# Patient Record
Sex: Male | Born: 1950 | Race: White | Hispanic: No | Marital: Single | Smoking: Former smoker
Health system: Southern US, Community
[De-identification: ages and names within clinical notes are randomized; demographics above are authoritative.]

## PROBLEM LIST (undated history)

## (undated) DIAGNOSIS — E559 Vitamin D deficiency, unspecified: Secondary | ICD-10-CM

## (undated) DIAGNOSIS — I871 Compression of vein: Secondary | ICD-10-CM

## (undated) DIAGNOSIS — I82409 Acute embolism and thrombosis of unspecified deep veins of unspecified lower extremity: Secondary | ICD-10-CM

## (undated) DIAGNOSIS — E785 Hyperlipidemia, unspecified: Secondary | ICD-10-CM

## (undated) DIAGNOSIS — E039 Hypothyroidism, unspecified: Secondary | ICD-10-CM

## (undated) DIAGNOSIS — N200 Calculus of kidney: Secondary | ICD-10-CM

## (undated) DIAGNOSIS — E538 Deficiency of other specified B group vitamins: Secondary | ICD-10-CM

## (undated) DIAGNOSIS — R7989 Other specified abnormal findings of blood chemistry: Secondary | ICD-10-CM

## (undated) DIAGNOSIS — F319 Bipolar disorder, unspecified: Secondary | ICD-10-CM

## (undated) DIAGNOSIS — I10 Essential (primary) hypertension: Secondary | ICD-10-CM

## (undated) HISTORY — DX: Compression of vein: I87.1

## (undated) HISTORY — DX: Hyperlipidemia, unspecified: E78.5

## (undated) HISTORY — DX: Essential (primary) hypertension: I10

## (undated) HISTORY — PX: ANKLE FRACTURE SURGERY: SHX122

## (undated) HISTORY — DX: Calculus of kidney: N20.0

---
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
## 1998-12-12 ENCOUNTER — Emergency Department (HOSPITAL_COMMUNITY): Admission: EM | Admit: 1998-12-12 | Discharge: 1998-12-12 | Payer: Self-pay | Admitting: Emergency Medicine

## 2008-02-17 ENCOUNTER — Ambulatory Visit: Payer: Self-pay | Admitting: Internal Medicine

## 2008-02-17 LAB — CONVERTED CEMR LAB
Albumin: 4.8 g/dL (ref 3.5–5.2)
CO2: 23 meq/L (ref 19–32)
Cholesterol: 294 mg/dL — ABNORMAL HIGH (ref 0–200)
Direct LDL: 189 mg/dL — ABNORMAL HIGH
Eosinophils Relative: 4 % (ref 0–5)
Glucose, Bld: 95 mg/dL (ref 70–99)
HCT: 51 % (ref 39.0–52.0)
Lymphocytes Relative: 28 % (ref 12–46)
Lymphs Abs: 1.8 10*3/uL (ref 0.7–4.0)
Neutrophils Relative %: 58 % (ref 43–77)
Platelets: 235 10*3/uL (ref 150–400)
Potassium: 4.7 meq/L (ref 3.5–5.3)
RBC: 4.88 M/uL (ref 4.22–5.81)
Sodium: 140 meq/L (ref 135–145)
Total Protein: 7.8 g/dL (ref 6.0–8.3)
Triglycerides: 447 mg/dL — ABNORMAL HIGH (ref <150)
WBC: 6.2 10*3/uL (ref 4.0–10.5)

## 2008-03-16 ENCOUNTER — Ambulatory Visit: Payer: Self-pay | Admitting: Internal Medicine

## 2008-03-16 LAB — CONVERTED CEMR LAB
ALT: 25 units/L (ref 0–53)
AST: 29 units/L (ref 0–37)
Albumin: 4.3 g/dL (ref 3.5–5.2)
Calcium: 9.6 mg/dL (ref 8.4–10.5)
Chloride: 105 meq/L (ref 96–112)
Direct LDL: 172 mg/dL — ABNORMAL HIGH
Potassium: 4 meq/L (ref 3.5–5.3)
Total CHOL/HDL Ratio: 6

## 2008-10-06 ENCOUNTER — Ambulatory Visit: Payer: Self-pay | Admitting: Internal Medicine

## 2009-01-23 ENCOUNTER — Ambulatory Visit: Payer: Self-pay | Admitting: Diagnostic Radiology

## 2009-01-23 ENCOUNTER — Emergency Department (HOSPITAL_BASED_OUTPATIENT_CLINIC_OR_DEPARTMENT_OTHER): Admission: EM | Admit: 2009-01-23 | Discharge: 2009-01-23 | Payer: Self-pay | Admitting: Emergency Medicine

## 2010-11-05 LAB — URINALYSIS, ROUTINE W REFLEX MICROSCOPIC
Glucose, UA: NEGATIVE mg/dL
Leukocytes, UA: NEGATIVE
Nitrite: NEGATIVE
Specific Gravity, Urine: 1.034 — ABNORMAL HIGH (ref 1.005–1.030)
pH: 5.5 (ref 5.0–8.0)

## 2010-11-05 LAB — URINE MICROSCOPIC-ADD ON

## 2010-11-05 LAB — POCT TOXICOLOGY PANEL: Tetrahydrocannabinol: POSITIVE

## 2010-11-05 LAB — DIFFERENTIAL
Eosinophils Relative: 0 % (ref 0–5)
Lymphocytes Relative: 15 % (ref 12–46)
Lymphs Abs: 1.4 10*3/uL (ref 0.7–4.0)
Neutro Abs: 6.9 10*3/uL (ref 1.7–7.7)
Neutrophils Relative %: 74 % (ref 43–77)

## 2010-11-05 LAB — CBC
HCT: 42 % (ref 39.0–52.0)
Platelets: 230 10*3/uL (ref 150–400)
WBC: 9.3 10*3/uL (ref 4.0–10.5)

## 2010-11-05 LAB — ETHANOL: Alcohol, Ethyl (B): <10 mg/dL (ref 0–10)

## 2010-11-05 LAB — BASIC METABOLIC PANEL
BUN: 17 mg/dL (ref 6–23)
Calcium: 9.2 mg/dL (ref 8.4–10.5)
GFR calc non Af Amer: >60 mL/min (ref >60)
Potassium: 3.9 mEq/L (ref 3.5–5.1)

## 2011-02-26 ENCOUNTER — Emergency Department (HOSPITAL_COMMUNITY): Payer: Medicare Other

## 2011-02-26 ENCOUNTER — Emergency Department (HOSPITAL_COMMUNITY)
Admission: EM | Admit: 2011-02-26 | Discharge: 2011-02-26 | Disposition: A | Discharge status: Home / Self Care | Payer: Medicare Other | Attending: Emergency Medicine | Admitting: Emergency Medicine

## 2011-02-26 DIAGNOSIS — M25476 Effusion, unspecified foot: Secondary | ICD-10-CM | POA: Insufficient documentation

## 2011-02-26 DIAGNOSIS — M2560 Stiffness of unspecified joint, not elsewhere classified: Secondary | ICD-10-CM | POA: Insufficient documentation

## 2011-02-26 DIAGNOSIS — M25473 Effusion, unspecified ankle: Secondary | ICD-10-CM | POA: Insufficient documentation

## 2011-02-26 DIAGNOSIS — S92009A Unspecified fracture of unspecified calcaneus, initial encounter for closed fracture: Secondary | ICD-10-CM | POA: Insufficient documentation

## 2011-02-26 DIAGNOSIS — W11XXXA Fall on and from ladder, initial encounter: Secondary | ICD-10-CM | POA: Insufficient documentation

## 2011-02-26 DIAGNOSIS — F319 Bipolar disorder, unspecified: Secondary | ICD-10-CM | POA: Insufficient documentation

## 2011-02-26 DIAGNOSIS — M25579 Pain in unspecified ankle and joints of unspecified foot: Secondary | ICD-10-CM | POA: Insufficient documentation

## 2011-03-03 NOTE — Consult Note (Signed)
Omar Rogers, DAY NO.:  [id number]  MEDICAL RECORD NO.:  [id number]  LOCATION:  [facility]                         FACILITY:  [facility]  PHYSICIAN:  Omar Arthurs, MD        DATE OF BIRTH:  1950/12/26  DATE OF CONSULTATION:  02/26/2011 DATE OF DISCHARGE:  02/26/2011                                CONSULTATION   REASON FOR CONSULTATION:  Right foot injury.  HISTORY OF PRESENT ILLNESS:  The patient is a 60 year old male with past medical history significant for bipolar disorder who fell an estimated 7 feet off of a ladder today.  He describes landing on his feet and denies any loss of consciousness.  He found it impossible to bear weight on his right foot after his injury and presents to the emergency department for further evaluation and treatment.  He denies any previous history of injury or surgery to this foot or ankle in the past.  He is not a smoker.  He is not diabetic.  He complains of severe pain in the heel that is worse with any attempt of bearing weight and is better with rest and elevation.  He is currently on disability due to his bipolar disorder.  PAST MEDICAL HISTORY:  Bipolar disorder.  PAST SURGICAL HISTORY:  None.  SOCIAL HISTORY:  The patient is on disability for bipolar disorder.  He does not use any tobacco products.  He drinks alcohol daily and admits to 2 beers per day.  FAMILY HISTORY:  Positive for hypertension in both of his parents.  REVIEW OF SYSTEMS:  No recent fever, chills, nausea, vomiting or changes of his appetite.  Review of systems as above and otherwise negative.  Physical exam, the patient is a well-nourished, well-developed male in no apparent distress.  He is alert and oriented x4.  Mood and affect are normal.  Extraocular motions are intact.  Respirations are unlabored. His foot is swollen posteriorly.  There is no breaks in the skin. Pulses are palpable.  Sensibility to light touch is intact.  He has 5/5 strength in  plantarflexion and dorsiflexion of his toes.  There is no lymphadenopathy noted.  X-rays, Harris heel view as well as AP, lateral, and mortise views of the ankle are obtained.  These show a displaced calcaneus fracture.  The posterior facet appears to have a longitudinal split and the lateral segment is depressed, relative to medial segment.  ASSESSMENT:  Right calcaneus fracture.  PLAN:  I explained to the patient and his wife the nature of this injury.  Based on his examination today I have asked Dr. Hyman Hopes to order a fine-cut CT of the calcaneus.  I am going to mobilize him in a short-leg splint and follow up with me for the next few days to check his skin and to schedule surgery.  He understands this plan and agrees this is a severe injury that has the significant risk of limiting his function.  Treatment of this injury requires complex medical decision making.     Omar Arthurs, MD     JH/MEDQ  D:  02/26/2011  T:  02/27/2011  Job:  [id number]  Electronically Signed by Omar Rogers  on  03/03/2011 03:16:54 PM

## 2011-03-05 ENCOUNTER — Emergency Department (HOSPITAL_COMMUNITY)
Admission: EM | Admit: 2011-03-05 | Discharge: 2011-03-06 | Disposition: A | Discharge status: Home / Self Care | Payer: Medicare Other | Attending: Emergency Medicine | Admitting: Emergency Medicine

## 2011-03-05 ENCOUNTER — Emergency Department (HOSPITAL_COMMUNITY): Payer: Medicare Other

## 2011-03-05 DIAGNOSIS — R509 Fever, unspecified: Secondary | ICD-10-CM | POA: Insufficient documentation

## 2011-03-05 DIAGNOSIS — R000 Tachycardia, unspecified: Secondary | ICD-10-CM | POA: Insufficient documentation

## 2011-03-05 DIAGNOSIS — M79609 Pain in unspecified limb: Secondary | ICD-10-CM | POA: Insufficient documentation

## 2011-03-05 DIAGNOSIS — Z9889 Other specified postprocedural states: Secondary | ICD-10-CM | POA: Insufficient documentation

## 2011-03-05 DIAGNOSIS — F319 Bipolar disorder, unspecified: Secondary | ICD-10-CM | POA: Insufficient documentation

## 2011-03-05 DIAGNOSIS — Z79899 Other long term (current) drug therapy: Secondary | ICD-10-CM | POA: Insufficient documentation

## 2011-03-05 DIAGNOSIS — M25579 Pain in unspecified ankle and joints of unspecified foot: Secondary | ICD-10-CM | POA: Insufficient documentation

## 2011-03-06 ENCOUNTER — Emergency Department (HOSPITAL_COMMUNITY): Payer: Medicare Other

## 2011-03-06 LAB — DIFFERENTIAL
Basophils Relative: 0 % (ref 0–1)
Eosinophils Absolute: 0 10*3/uL (ref 0.0–0.7)
Eosinophils Relative: 0 % (ref 0–5)
Neutrophils Relative %: 80 % — ABNORMAL HIGH (ref 43–77)

## 2011-03-06 LAB — CBC
MCH: 35.1 pg — ABNORMAL HIGH (ref 26.0–34.0)
Platelets: 206 10*3/uL (ref 150–400)
RBC: 3.9 MIL/uL — ABNORMAL LOW (ref 4.22–5.81)
RDW: 13 % (ref 11.5–15.5)
WBC: 11.2 10*3/uL — ABNORMAL HIGH (ref 4.0–10.5)

## 2011-03-12 LAB — CULTURE, BLOOD (ROUTINE X 2): Culture: NO GROWTH

## 2013-06-10 ENCOUNTER — Other Ambulatory Visit: Payer: Self-pay | Admitting: *Deleted

## 2013-06-10 DIAGNOSIS — I83893 Varicose veins of bilateral lower extremities with other complications: Secondary | ICD-10-CM

## 2013-06-29 ENCOUNTER — Inpatient Hospital Stay (HOSPITAL_COMMUNITY)
Admission: EM | Admit: 2013-06-29 | Discharge: 2013-07-02 | DRG: 238 | Disposition: A | Discharge status: Home / Self Care | Payer: Medicare Other | Attending: Internal Medicine | Admitting: Internal Medicine

## 2013-06-29 ENCOUNTER — Encounter (HOSPITAL_COMMUNITY): Payer: Self-pay | Admitting: Emergency Medicine

## 2013-06-29 DIAGNOSIS — F319 Bipolar disorder, unspecified: Secondary | ICD-10-CM | POA: Diagnosis present

## 2013-06-29 DIAGNOSIS — R310 Gross hematuria: Secondary | ICD-10-CM | POA: Diagnosis not present

## 2013-06-29 DIAGNOSIS — O2230 Deep phlebothrombosis in pregnancy, unspecified trimester: Secondary | ICD-10-CM | POA: Diagnosis present

## 2013-06-29 DIAGNOSIS — I824Y9 Acute embolism and thrombosis of unspecified deep veins of unspecified proximal lower extremity: Principal | ICD-10-CM | POA: Diagnosis present

## 2013-06-29 DIAGNOSIS — Z87891 Personal history of nicotine dependence: Secondary | ICD-10-CM

## 2013-06-29 DIAGNOSIS — Z79899 Other long term (current) drug therapy: Secondary | ICD-10-CM

## 2013-06-29 DIAGNOSIS — I82402 Acute embolism and thrombosis of unspecified deep veins of left lower extremity: Secondary | ICD-10-CM

## 2013-06-29 DIAGNOSIS — I82409 Acute embolism and thrombosis of unspecified deep veins of unspecified lower extremity: Secondary | ICD-10-CM

## 2013-06-29 DIAGNOSIS — D72829 Elevated white blood cell count, unspecified: Secondary | ICD-10-CM | POA: Diagnosis present

## 2013-06-29 DIAGNOSIS — I871 Compression of vein: Secondary | ICD-10-CM | POA: Diagnosis present

## 2013-06-29 DIAGNOSIS — E871 Hypo-osmolality and hyponatremia: Secondary | ICD-10-CM | POA: Diagnosis present

## 2013-06-29 DIAGNOSIS — I82629 Acute embolism and thrombosis of deep veins of unspecified upper extremity: Secondary | ICD-10-CM

## 2013-06-29 HISTORY — DX: Acute embolism and thrombosis of unspecified deep veins of unspecified lower extremity: I82.409

## 2013-06-29 HISTORY — DX: Bipolar disorder, unspecified: F31.9

## 2013-06-29 LAB — CBC WITH DIFFERENTIAL/PLATELET
Basophils Relative: 0 % (ref 0–1)
Eosinophils Absolute: 0.2 10*3/uL (ref 0.0–0.7)
Eosinophils Relative: 1 % (ref 0–5)
Hemoglobin: 16.7 g/dL (ref 13.0–17.0)
Lymphs Abs: 1.1 10*3/uL (ref 0.7–4.0)
MCH: 36.7 pg — ABNORMAL HIGH (ref 26.0–34.0)
MCHC: 34.5 g/dL (ref 30.0–36.0)
MCV: 106.4 fL — ABNORMAL HIGH (ref 78.0–100.0)
Monocytes Absolute: 1.6 10*3/uL — ABNORMAL HIGH (ref 0.1–1.0)
Monocytes Relative: 15 % — ABNORMAL HIGH (ref 3–12)
Neutrophils Relative %: 75 % (ref 43–77)
RBC: 4.55 MIL/uL (ref 4.22–5.81)

## 2013-06-29 LAB — COMPREHENSIVE METABOLIC PANEL
Albumin: 3.3 g/dL — ABNORMAL LOW (ref 3.5–5.2)
Alkaline Phosphatase: 79 U/L (ref 39–117)
BUN: 12 mg/dL (ref 6–23)
CO2: 21 mEq/L (ref 19–32)
Chloride: 99 mEq/L (ref 96–112)
Creatinine, Ser: 0.72 mg/dL (ref 0.50–1.35)
GFR calc Af Amer: >90 mL/min (ref >90)
GFR calc non Af Amer: >90 mL/min (ref >90)
Glucose, Bld: 107 mg/dL — ABNORMAL HIGH (ref 70–99)
Potassium: 4.8 mEq/L (ref 3.5–5.1)
Total Bilirubin: 1.2 mg/dL (ref 0.3–1.2)

## 2013-06-29 LAB — FIBRINOGEN: Fibrinogen: 520 mg/dL — ABNORMAL HIGH (ref 204–475)

## 2013-06-29 LAB — URINALYSIS, ROUTINE W REFLEX MICROSCOPIC
Bilirubin Urine: NEGATIVE
Glucose, UA: NEGATIVE mg/dL
Ketones, ur: 15 mg/dL — AB
Leukocytes, UA: NEGATIVE
Nitrite: NEGATIVE
Protein, ur: NEGATIVE mg/dL
Urobilinogen, UA: 1 mg/dL (ref 0.0–1.0)

## 2013-06-29 LAB — PROTIME-INR: INR: 1.09 (ref 0.00–1.49)

## 2013-06-29 LAB — HEPARIN LEVEL (UNFRACTIONATED): Heparin Unfractionated: <0.1 IU/mL — ABNORMAL LOW (ref 0.30–0.70)

## 2013-06-29 MED ORDER — HEPARIN BOLUS VIA INFUSION
4000.0000 [IU] | Freq: Once | INTRAVENOUS | Status: AC
  Administered 2013-06-29: 4000 [IU] via INTRAVENOUS
  Filled 2013-06-29: qty 4000

## 2013-06-29 MED ORDER — OXYCODONE HCL 5 MG PO TABS
5.0000 mg | ORAL_TABLET | ORAL | Status: DC | PRN
  Administered 2013-06-29 – 2013-07-02 (×8): 5 mg via ORAL
  Filled 2013-06-29 (×8): qty 1

## 2013-06-29 MED ORDER — HEPARIN BOLUS VIA INFUSION
3000.0000 [IU] | Freq: Once | INTRAVENOUS | Status: AC
  Administered 2013-06-29: 3000 [IU] via INTRAVENOUS
  Filled 2013-06-29: qty 3000

## 2013-06-29 MED ORDER — MORPHINE SULFATE 2 MG/ML IJ SOLN
1.0000 mg | INTRAMUSCULAR | Status: DC | PRN
  Administered 2013-06-29: 1 mg via INTRAVENOUS
  Filled 2013-06-29: qty 1

## 2013-06-29 MED ORDER — HEPARIN (PORCINE) IN NACL 100-0.45 UNIT/ML-% IJ SOLN
1600.0000 [IU]/h | INTRAMUSCULAR | Status: DC
  Administered 2013-06-29: 1300 [IU]/h via INTRAVENOUS
  Administered 2013-06-29: 1600 [IU]/h via INTRAVENOUS
  Filled 2013-06-29 (×3): qty 250

## 2013-06-29 MED ORDER — SODIUM CHLORIDE 0.9 % IJ SOLN
3.0000 mL | Freq: Two times a day (BID) | INTRAMUSCULAR | Status: DC
  Administered 2013-06-30: 3 mL via INTRAVENOUS

## 2013-06-29 NOTE — H&P (Addendum)
Chief Complaint: "Left leg swelling x 4 days, left leg pain x 1 month." Referring Physician: Dr. Myra Gianotti HPI: Omar Rogers is an 62 y.o. male with no significant PMHx, he presents today after being see as an outpatient and found to have extensive LLE DVT. The patient was seen by Dr. Jimmie Molly at [facility]. The patient was found to have a DVT from the mid thigh up to the common femoral and possible extension into the iliac vein. He c/o LLE pain x 1 month starting distal and progressing proximal to his upper thigh with increased pain now. He states he did not notice swelling until 4 days ago. He denies any shortness of breath or chest pain. He denies any history of cancer, recent surgeries, recent travel, or injury. He denies any history of DVT or family history of clotting disorders. He denies any tobacco use or change in medications, he does admit that he is not very active. He denies any history of bleeding disorders, gastrointestinal bleeding or brain bleeding. He denies any active bleeding, blood in his stool or urine. Request received for image guided LLE venogram with possible lysis and intervention.   Past Medical History: History reviewed. No pertinent past medical history.  Past Surgical History:  Past Surgical History  Procedure Laterality Date  . Right foot surgery      Family History: History reviewed. No pertinent family history.  Social History:  reports that he has never smoked. He does not have any smokeless tobacco history on file. He reports that he drinks alcohol. His drug history is not on file.  Allergies: No Known Allergies  Medications:   Medication List    ASK your doctor about these medications       naproxen sodium 220 MG tablet  Commonly known as:  ANAPROX  Take 220 mg by mouth once.       Please HPI for pertinent positives, otherwise complete 10 system ROS negative.  Physical Exam: BP 128/78  Pulse 88  Temp(Src) 98.3 F (36.8 C)  (Oral)  Resp 16  Ht 5\' 10"  (1.778 m)  Wt 200 lb (90.719 kg)  BMI 28.70 kg/m2  SpO2 95% Body mass index is 28.7 kg/(m^2).  General Appearance:  Alert, cooperative, no distress  Head:  Normocephalic, without obvious abnormality, atraumatic  Neck: Supple, symmetrical, trachea midline  Lungs:   Clear to auscultation bilaterally, no w/r/r, respirations unlabored without use of accessory muscles.  Chest Wall:  No tenderness or deformity  Heart:  Regular rate and rhythm, S1, S2 normal, no murmur, rub or gallop.  Abdomen:   Soft, non-tender, non distended, (+) BS  Extremities: LLE proximal erythematous and warm to touch, distal foot slight cyanosis with 3+ edema throughout, atraumatic, RLE without skin changes or edema  Pulses: RLE 2+, LLE doppler   Neurologic: Normal affect, no gross deficits.   Results for orders placed during the hospital encounter of 06/29/13 (from the past 48 hour(s))  CBC WITH DIFFERENTIAL     Status: Abnormal   Collection Time    06/29/13  1:21 PM      Result Value Range   WBC 11.1 (*) 4.0 - 10.5 K/uL   RBC 4.55  4.22 - 5.81 MIL/uL   Hemoglobin 16.7  13.0 - 17.0 g/dL   HCT 16.1  09.6 - 04.5 %   MCV 106.4 (*) 78.0 - 100.0 fL   MCH 36.7 (*) 26.0 - 34.0 pg   MCHC 34.5  30.0 - 36.0 g/dL  RDW 13.8  11.5 - 15.5 %   Platelets 131 (*) 150 - 400 K/uL   Neutrophils Relative % 75  43 - 77 %   Neutro Abs 8.3 (*) 1.7 - 7.7 K/uL   Lymphocytes Relative 9 (*) 12 - 46 %   Lymphs Abs 1.1  0.7 - 4.0 K/uL   Monocytes Relative 15 (*) 3 - 12 %   Monocytes Absolute 1.6 (*) 0.1 - 1.0 K/uL   Eosinophils Relative 1  0 - 5 %   Eosinophils Absolute 0.2  0.0 - 0.7 K/uL   Basophils Relative 0  0 - 1 %   Basophils Absolute 0.0  0.0 - 0.1 K/uL  PROTIME-INR     Status: None   Collection Time    06/29/13  1:21 PM      Result Value Range   Prothrombin Time 13.9  11.6 - 15.2 seconds   INR 1.09  0.00 - 1.49  APTT     Status: None   Collection Time    06/29/13  1:21 PM      Result Value  Range   aPTT 31  24 - 37 seconds  COMPREHENSIVE METABOLIC PANEL     Status: Abnormal   Collection Time    06/29/13  1:21 PM      Result Value Range   Sodium 133 (*) 135 - 145 mEq/L   Potassium 4.8  3.5 - 5.1 mEq/L   Comment: HEMOLYSIS AT THIS LEVEL MAY AFFECT RESULT   Chloride 99  96 - 112 mEq/L   CO2 21  19 - 32 mEq/L   Glucose, Bld 107 (*) 70 - 99 mg/dL   BUN 12  6 - 23 mg/dL   Creatinine, Ser 1.61  0.50 - 1.35 mg/dL   Calcium 8.6  8.4 - 09.6 mg/dL   Total Protein 6.5  6.0 - 8.3 g/dL   Albumin 3.3 (*) 3.5 - 5.2 g/dL   AST 41 (*) 0 - 37 U/L   Comment: HEMOLYSIS AT THIS LEVEL MAY AFFECT RESULT   ALT 27  0 - 53 U/L   Comment: HEMOLYSIS AT THIS LEVEL MAY AFFECT RESULT   Alkaline Phosphatase 79  39 - 117 U/L   Total Bilirubin 1.2  0.3 - 1.2 mg/dL   GFR calc non Af Amer >90  >90 mL/min   GFR calc Af Amer >90  >90 mL/min   Comment: (NOTE)     The eGFR has been calculated using the CKD EPI equation.     This calculation has not been validated in all clinical situations.     eGFR's persistently <90 mL/min signify possible Chronic Kidney     Disease.   No results found.  Assessment/Plan Extensive LLE DVT on IV heparin Request for image guided lower extremity venogram with possible lysis, angioplasty or stenting. Labs reviewed, patient will be NPO after midnight, no history of bleeding. Risks and Benefits discussed with the patient. All of the patient's questions were answered, patient is agreeable to proceed. Consent signed and in IR.   Pattricia Boss D PA-C 06/29/2013, 3:47 PM

## 2013-06-29 NOTE — ED Notes (Signed)
Pt from Dr office, c/o blood clot in left leg. Pt states slight swelling x 1 month. Increase in and pain with ambulation starting fri. Pt received Ulstrasound at Drs, confirmed bloodclot to left upper leg. Pt sent here for further eval.

## 2013-06-29 NOTE — ED Notes (Signed)
Vascular MD at bedside.

## 2013-06-29 NOTE — Consult Note (Signed)
Consult Note  Patient name: Omar Rogers MRN: [id number] DOB: 1951/04/20 Sex: male  Consulting Physician:  Emergency department  Reason for Consult:  Chief Complaint  Patient presents with  . DVT    HISTORY OF PRESENT ILLNESS: This is a 62 year old gentleman with no significant past medical history who has been having left leg pain for approximately one month.  He states that his pain increased significantly this past Friday.  He went to see [facility].  Ultrasound in their office revealed deep vein thrombosis from the mid thigh up through the common femoral vein, possibly extending into the iliac vein.  The patient complains of pain in his calf.  He denies any exacerbating factors such as prolonged immobility or recent illness with dehydration.  He denies previous history of DVT or PE.  There is no history of hypercoagulable state in the patient or within his family.  He denies shortness of breath.  The patient is a nonsmoker.  Past medical history: Negative for hypertension, hypercholesterolemia, diabetes  Past Surgical History  Procedure Laterality Date  . Right foot surgery      History   Social History  . Marital Status: Single    Spouse Name: N/A    Number of Children: N/A  . Years of Education: N/A   Occupational History  . Not on file.   Social History Main Topics  . Smoking status: Never Smoker   . Smokeless tobacco: Not on file  . Alcohol Use: 0.0 oz/week    6-8 Cans of beer per week  . Drug Use: Not on file  . Sexual Activity: Not on file   Other Topics Concern  . Not on file   Social History Narrative  . No narrative on file    Family history: No history of clotting disorders.  Allergies as of 06/29/2013  . (No Known Allergies)    No current facility-administered medications on file prior to encounter.   No current outpatient prescriptions on file prior to encounter.     REVIEW OF SYSTEMS: Cardiovascular: No chest  pain, chest pressure, palpitations, orthopnea, or dyspnea on exertion. No claudication or rest pain,  Pulmonary: No productive cough, asthma or wheezing. Neurologic: No weakness, paresthesias, aphasia, or amaurosis. No dizziness. Hematologic: No bleeding problems or clotting disorders. Musculoskeletal: No joint pain or joint swelling. Gastrointestinal: No blood in stool or hematemesis Genitourinary: No dysuria or hematuria. Psychiatric:: No history of major depression. Integumentary: No rashes or ulcers. Constitutional: No fever or chills.  PHYSICAL EXAMINATION: General: The patient appears their stated age.  Vital signs are BP 113/75  Pulse 88  Temp(Src) 98.9 F (37.2 C) (Oral)  Resp 16  Ht 5\' 10"  (1.778 m)  Wt 199 lb (90.266 kg)  BMI 28.55 kg/m2  SpO2 99% Pulmonary: Respirations are non-labored HEENT:  No gross abnormalities Abdomen: Soft and non-tender  Musculoskeletal: There are no major deformities.   Neurologic: No focal weakness or paresthesias are detected, Skin: There are no ulcer or rashes noted. Psychiatric: The patient has normal affect. Cardiovascular: There is a regular rate and rhythm without significant murmur appreciated.  The patient has palpable pedal pulses bilaterally.  There is edema to the left leg from the knee down.  The patient does have calf tenderness.  Diagnostic Studies: Per outside imaging there is an extensive left iliofemoral popliteal DVT    Assessment:  Left iliofemoral DVT Plan: The etiology for the patient's DVT is unknown at this  time.  He'll need to undergo a hypercoagulable workup.  He will need to be started on IV heparin, including a bolus and continuous infusion.  He needs to keep his left leg elevated and was eventually require the use of 20-30 mm compression stockings.  I think the patient is at high-risk for postphlebitic syndrome.  According to the ultrasound report from [facility], he hasn't iliofemoral DVT.  I think he would be  a good candidate for thrombolyzes.  I have spoken to interventional radiology and they will evaluate the patient.  Should vascular surgery assistance be required in the future, please contact me     V. Charlena Cross, M.D. [facility] Office: [phone] Pager:  [phone]

## 2013-06-29 NOTE — H&P (Signed)
[facility] History and Physical  BRICESON BROADWATER ZOX:[id number] DOB: 15-Jul-1951 DOA: 06/29/2013  Referring physician: EDP PCP: No PCP Per Patient  Specialists: none  Chief Complaint: sent in from [facility] for DVT OF THE LLE  HPI: JONAVIN Rogers is a 62 y.o. male with h/o bipolar disorder not on any medications, started having LLE swelling since one week. He took an appointment at [facility] and underwent venous duplex, found to have a DVT of LLE. He was sent to [facility] [place] for further management . On arrival to ED, he was started on IV heparin and vascular surgery consulted for the large DVT in the Left femoral vein. IR Consulted for Lysis of the DVT. HE was referred to medical service for admission for LLE dvt.  He denies any other complaints. No family h/o DVT OR PE.   Review of Systems: The patient denies anorexia, fever, weight loss,, vision loss, decreased hearing, hoarseness, chest pain, syncope, dyspnea on exertion, peripheral edema, balance deficits, hemoptysis, abdominal pain, melena, hematochezia, severe indigestion/heartburn, hematuria, incontinence, genital sores, muscle weakness, suspicious skin lesions, transient blindness, difficulty walking, depression, unusual weight change, abnormal bleeding, enlarged lymph nodes, angioedema, and breast masses.   Past Medical History  Diagnosis Date  . DVT (deep venous thrombosis) 06/29/2013    extensive left iliofemoral popliteal DVT/notes (06/29/2013)  . Bipolar disorder    Past Surgical History  Procedure Laterality Date  . Ankle fracture surgery Right ~ 2012   Social History:  reports that he has quit smoking. His smoking use included Cigarettes. He has a 2.5 pack-year smoking history. He has never used smokeless tobacco. He reports that he drinks about 25.2 ounces of alcohol per week. He reports that he does not use illicit drugs.  where does patient live--home,  Can patient participate in  ADLs? yes  No Known Allergies  History reviewed. No pertinent family history. mother _ h/o cancer.   Prior to Admission medications   Medication Sig Start Date End Date Taking? Authorizing Provider  naproxen sodium (ANAPROX) 220 MG tablet Take 220 mg by mouth once.   Yes Historical Provider, MD   Physical Exam: Filed Vitals:   06/29/13 1546  BP: 113/75  Pulse: 88  Temp: 98.9 F (37.2 C)  Resp:     Constitutional: Vital signs reviewed.  Patient is a well-developed and well-nourished  in no acute distress and cooperative with exam. Alert and oriented x3.  Head: Normocephalic and atraumatic Mouth: no erythema or exudates, MMM Eyes: PERRL, EOMI, conjunctivae normal, No scleral icterus.  Neck: Supple, Trachea midline normal ROM, No JVD, mass, thyromegaly, or carotid bruit present.  Cardiovascular: RRR, S1 normal, S2 normal, no MRG, pulses symmetric and intact bilaterally Pulmonary/Chest: normal respiratory effort, CTAB, no wheezes, rales, or rhonchi Abdominal: Soft. Non-tender, non-distended, bowel sounds are normal, no masses, organomegaly, or guarding present.  Musculoskeletal: LLE swollen.  Neurological: A&O x3, Strength is normal and symmetric bilaterally, cranial nerve II-XII are grossly intact, no focal motor deficit, sensory intact to light touch bilaterally.  Skin: Warm, dry and intact. No rash, cyanosis, or clubbing.  Psychiatric: Normal mood and affect. speech and behavior is normal. Judgment and thought content normal. Cognition and memory are normal.     Labs on Admission:  Basic Metabolic Panel:  Recent Labs Lab 06/29/13 1321  NA 133*  K 4.8  CL 99  CO2 21  GLUCOSE 107*  BUN 12  CREATININE 0.72  CALCIUM 8.6   Liver Function Tests:  Recent Labs Lab 06/29/13 1321  AST 41*  ALT 27  ALKPHOS 79  BILITOT 1.2  PROT 6.5  ALBUMIN 3.3*   No results found for this basename: LIPASE, AMYLASE,  in the last 168 hours No results found for this basename: AMMONIA,   in the last 168 hours CBC:  Recent Labs Lab 06/29/13 1321  WBC 11.1*  NEUTROABS 8.3*  HGB 16.7  HCT 48.4  MCV 106.4*  PLT 131*   Cardiac Enzymes: No results found for this basename: CKTOTAL, CKMB, CKMBINDEX, TROPONINI,  in the last 168 hours  BNP (last 3 results) No results found for this basename: PROBNP,  in the last 8760 hours CBG: No results found for this basename: GLUCAP,  in the last 168 hours  Radiological Exams on Admission: No results found.    Assessment/Plan Active Problems:   DVT of the LLE   DVT (deep venous thrombosis)   DVT of the LLE: - Admit TO TELE - started the patient on IV heparin.  - IR consulted for thrombolysis , plan in am.  - NPO after midnight.  - appreciate vascular surgery consult.  - coag panel ordered by ED. - will need evaluation for occult malignancy if coag panel is negative.    2. Mild leukocytosis: - pt is afebrile. Does not look toxic.  - continue to monitor.   3. Bipolar disorder - appears to be stable.   DVT prophylaxis.   Code Status:full code Family Communication: none at bedside.discussed the plan of care with the tpatient.  Disposition Plan: pending.   Time spent: 60 min  Jora Galluzzo [facility] Pager [phone]  If 7PM-7AM, please contact night-coverage [email or web address] Password [facility] 06/29/2013, 6:08 PM

## 2013-06-29 NOTE — Progress Notes (Signed)
PHARMACY FOLLOW UP NOTE   Pharmacy Consult for : Heparin Indication: Large L-Femoral Vein DVT  Dosing Weight: 90 kg  Labs:  Recent Labs  06/29/13 1321 06/29/13 2100  HGB 16.7  --   HCT 48.4  --   PLT 131*  --   APTT 31  --   LABPROT 13.9  --   INR 1.09  --   HEPARINUNFRC  --  <0.10*    Estimated Creatinine Clearance: 111 ml/min (by C-G formula based on Cr of 0.72).  Pertinent Medications:  Infusions:  . HEPARIN 1,300 Units/hr (06/29/13 1456)    Assessment:  61 y/o male on Heparin infusion for new large left femoral vein DVT.  Heparin rate 1300 units/hr.  Heparin level SUBtherapeutic < 0.1 unit/ml.  No bleeding complications noted   Goal:  Heparin level 0.3-0.7 units/ml   Plan: 1. Heparin 3000 unit bolus, then increase Heparin infusion to 1600 units/hr. 2. Will check Heparin level with AM Labs  3. Daily Heparin Levels, CBC.  Monitor for bleeding complications    Rayford Halsted.D 06/29/2013, 10:17 PM

## 2013-06-29 NOTE — Progress Notes (Signed)
ANTICOAGULATION CONSULT NOTE - Initial Consult  Pharmacy Consult for Heparin Indication: DVT  No Known Allergies  Patient Measurements: Height: 5\' 10"  (177.8 cm) Weight: 200 lb (90.719 kg) IBW/kg (Calculated) : 73  Vital Signs: Temp: 98.3 F (36.8 C) (12/02 1223) Temp src: Oral (12/02 1223) BP: 129/79 mmHg (12/02 1345) Pulse Rate: 88 (12/02 1345)  Labs:  Recent Labs  06/29/13 1321  HGB 16.7  HCT 48.4  PLT 131*  APTT 31  LABPROT 13.9  INR 1.09    Estimated Creatinine Clearance: 89 ml/min (by C-G formula based on Cr of 1).  Assessment: 62 year old male beginning IV heparin for DVT.  Consult to vascular surgery.  Goal of Therapy:  Heparin level 0.3-0.7 units/ml Monitor platelets by anticoagulation protocol: Yes   Plan:  1) Heparin 4000 units iv bolus x 1 2) Heparin drip at 1300 units / hr 3) Heparin level 6 hours after heparin begins 4) Daily heparin level, CBC  Thank you. Okey Regal, PharmD [phone]  06/29/2013,2:13 PM

## 2013-06-29 NOTE — ED Provider Notes (Signed)
Medical screening examination/treatment/procedure(s) were conducted as a shared visit with non-physician practitioner(s) or resident and myself. I personally evaluated the patient during the encounter and agree with the findings and plan unless otherwise indicated.  I have personally reviewed any xrays and/ or EKG's with the provider and I agree with interpretation.  Left leg swelling and pain for one month, worsened recently. Sent over from outpt office for vascular consult for possible Cerulea dolens. Pt has significant pain/ swelling LLE, doppled pulses intact, no cyanosis. Plan for consult to discuss options other than po anticoagulants. No sob or cp.   Admitted to vascular DVT left, left leg swelling  Enid Skeens, MD 06/29/13 [phone]

## 2013-06-29 NOTE — ED Provider Notes (Signed)
CSN: [id number]     Arrival date & time 06/29/13  1203 History   First MD Initiated Contact with Patient 06/29/13 1228     Chief Complaint  Patient presents with  . DVT   (Consider location/radiation/quality/duration/timing/severity/associated sxs/prior Treatment) The history is provided by the patient and medical records.   Omar Rogers Is a 62 year old male with a past medical history of bipolar disorder.  Patient currently takes Aleve and Zyprexa.  He has no known PCP.  The patient was sent into the emergency department by Dr. Jimmie Molly at [facility] for confirmed DVT.  I spoke with Dr. Jimmie Molly on the phone .  He states that the patient has confirmed DVT from the mid thigh up to the common femoral.  He feels there is extension into the iliac vein.  The patient has had one month of claudication symptoms in the left leg.  He complains of pain with walking.  States it is not bad while he is at rest.  Patient states that on Thursday 41/27/201 he had significant increase in the pain of his leg with significant swelling in the left extremity.  The patient lives by himself.  He has no PCP. Patient denies history of clotting, family history of clotting, recent injury or confinement, smoking, recent surgery.  Patient denies any long travel.  He has no known brisk factors for clot development. Patient denies pleuritic chest pain, shortness of breath, hemoptysis.  History reviewed. No pertinent past medical history. History reviewed. No pertinent past surgical history. History reviewed. No pertinent family history. History  Substance Use Topics  . Smoking status: Never Smoker   . Smokeless tobacco: Not on file  . Alcohol Use: 0.0 oz/week    6-8 Cans of beer per week    Review of Systems Ten systems reviewed and are negative for acute change, except as noted in the HPI.   Allergies  Review of patient's allergies indicates no known allergies.  Home Medications   Current  Outpatient Rx  Name  Route  Sig  Dispense  Refill  . naproxen sodium (ANAPROX) 220 MG tablet   Oral   Take 220 mg by mouth once.          BP 128/68  Pulse 95  Temp(Src) 98.3 F (36.8 C) (Oral)  Resp 18  SpO2 97% Physical Exam Physical Exam  Nursing note and vitals reviewed. Constitutional: He appears well-developed and well-nourished. No distress.  HENT:  Head: Normocephalic and atraumatic.  Eyes: Conjunctivae normal are normal. No scleral icterus.  Neck: Normal range of motion. Neck supple.  Cardiovascular: Normal rate, regular rhythm and normal heart sounds.   L leg with pitting edema up to the inguinal crease.  There is warmth and tenderness in the medial thigh.  Left foot is cold, bluish in color, with significant pitting edema.  Pulse is minimally palpable and confirmed with Doppler. Pulmonary/Chest: Effort normal and breath sounds normal. No respiratory distress.  Abdominal: Soft. There is no tenderness.  Musculoskeletal: He exhibits no edema.  Neurological: He is alert.  Skin: Skin is warm and dry. He is not diaphoretic.  Psychiatric: His behavior is normal.    ED Course  Procedures (including critical care time) Labs Review Labs Reviewed - No data to display Imaging Review No results found.  EKG Interpretation   None       MDM  No diagnosis found. 1:40 PM Filed Vitals:   06/29/13 1247  BP: 128/68  Pulse: 95  Temp:  Resp: 18   Patient with pain  And DVT. I spoke with Dr. Jimmie Molly who was concerned with possbile to cerulea dolens.  Was by Dr. Myra Gianotti who will come and evaluate the patient here in the ED.  He asked that we elevate the leg and begin heparin drip.  The patient with leukocytosis, mild left shift.  He has a elevated MCV with normal hemoglobin, platelet are mildly low.  She has a elevated AST ALT ratio.  He suggests to alcoholism consider during his MCV and elevated AST.    3:50 PM Filed Vitals:   06/29/13 1546  BP: 113/75  Pulse: 88   Temp: 98.9 F (37.2 C)  Resp:    Patient with DVT.  He'll be admitted by Dr. Blake Divine.  She is asked for a coagulation study panel ordered.  Patient will consult with interventional radiology. The patient appears reasonably stabilized for admission considering the current resources, flow, and capabilities available in the ED at this time, and I doubt any other [facility] requiring further screening and/or treatment in the ED prior to admission.    Arthor Captain, PA-C 06/29/13 1550

## 2013-06-30 ENCOUNTER — Inpatient Hospital Stay (HOSPITAL_COMMUNITY): Payer: Medicare Other

## 2013-06-30 LAB — APTT
aPTT: 63 s — ABNORMAL HIGH (ref 24–37)
aPTT: 51 s — ABNORMAL HIGH (ref 24–37)

## 2013-06-30 LAB — HEPARIN LEVEL (UNFRACTIONATED)
Heparin Unfractionated: 0.25 IU/mL — ABNORMAL LOW (ref 0.30–0.70)
Heparin Unfractionated: <0.1 IU/mL — ABNORMAL LOW (ref 0.30–0.70)

## 2013-06-30 LAB — CBC
HCT: 42.2 % (ref 39.0–52.0)
Hemoglobin: 14.7 g/dL (ref 13.0–17.0)
MCH: 37.6 pg — ABNORMAL HIGH (ref 26.0–34.0)
MCHC: 34.8 g/dL (ref 30.0–36.0)
MCV: 107.9 fL — ABNORMAL HIGH (ref 78.0–100.0)
Platelets: 138 10*3/uL — ABNORMAL LOW (ref 150–400)
Platelets: 133 10*3/uL — ABNORMAL LOW (ref 150–400)
RBC: 4.01 MIL/uL — ABNORMAL LOW (ref 4.22–5.81)
WBC: 8.8 10*3/uL (ref 4.0–10.5)

## 2013-06-30 LAB — BASIC METABOLIC PANEL
CO2: 25 mEq/L (ref 19–32)
Calcium: 8.3 mg/dL — ABNORMAL LOW (ref 8.4–10.5)
Chloride: 103 mEq/L (ref 96–112)
Potassium: 3.7 mEq/L (ref 3.5–5.1)
Sodium: 139 mEq/L (ref 135–145)

## 2013-06-30 LAB — FOLATE RBC: RBC Folate: 610 ng/mL — ABNORMAL HIGH (ref >280)

## 2013-06-30 LAB — MRSA PCR SCREENING: MRSA by PCR: NEGATIVE

## 2013-06-30 LAB — GLUCOSE, CAPILLARY: Glucose-Capillary: 105 mg/dL — ABNORMAL HIGH (ref 70–99)

## 2013-06-30 LAB — PROTIME-INR
INR: 1.1 (ref 0.00–1.49)
Prothrombin Time: 14 seconds (ref 11.6–15.2)

## 2013-06-30 LAB — VITAMIN B12: Vitamin B-12: 451 pg/mL (ref 211–911)

## 2013-06-30 LAB — FIBRINOGEN: Fibrinogen: 652 mg/dL — ABNORMAL HIGH (ref 204–475)

## 2013-06-30 MED ORDER — SODIUM CHLORIDE 0.9 % IV SOLN
250.0000 mL | INTRAVENOUS | Status: DC
  Administered 2013-06-30: 17 mL via INTRAVENOUS

## 2013-06-30 MED ORDER — HEPARIN (PORCINE) IN NACL 100-0.45 UNIT/ML-% IJ SOLN
800.0000 [IU]/h | INTRAMUSCULAR | Status: DC
  Filled 2013-06-30: qty 250

## 2013-06-30 MED ORDER — MIDAZOLAM HCL 2 MG/2ML IJ SOLN
INTRAMUSCULAR | Status: AC | PRN
  Administered 2013-06-30 (×4): 1 mg via INTRAVENOUS

## 2013-06-30 MED ORDER — TENECTEPLASE 50 MG IV KIT
0.4000 mg/h | PACK | INTRAVENOUS | Status: DC
  Filled 2013-06-30: qty 1

## 2013-06-30 MED ORDER — SODIUM CHLORIDE 0.9 % IV SOLN
0.2000 mg/h | INTRAVENOUS | Status: DC
  Administered 2013-06-30: 0.4 mg/h
  Administered 2013-07-01: 0.2 mg/h
  Filled 2013-06-30 (×2): qty 1

## 2013-06-30 MED ORDER — MORPHINE SULFATE 4 MG/ML IJ SOLN: 5.0000 mg | INTRAMUSCULAR | Status: DC | PRN

## 2013-06-30 MED ORDER — FENTANYL CITRATE 0.05 MG/ML IJ SOLN
INTRAMUSCULAR | Status: AC
  Filled 2013-06-30: qty 4

## 2013-06-30 MED ORDER — SODIUM CHLORIDE 0.9 % IJ SOLN: 3.0000 mL | INTRAMUSCULAR | Status: DC | PRN

## 2013-06-30 MED ORDER — HEPARIN (PORCINE) IN NACL 100-0.45 UNIT/ML-% IJ SOLN
2000.0000 [IU]/h | INTRAMUSCULAR | Status: DC
  Administered 2013-06-30: 1800 [IU]/h via INTRAVENOUS
  Filled 2013-06-30 (×4): qty 250

## 2013-06-30 MED ORDER — MORPHINE SULFATE 2 MG/ML IJ SOLN
2.0000 mg | Freq: Once | INTRAMUSCULAR | Status: AC
  Administered 2013-06-30: 2 mg via INTRAVENOUS

## 2013-06-30 MED ORDER — FENTANYL CITRATE 0.05 MG/ML IJ SOLN
INTRAMUSCULAR | Status: AC | PRN
  Administered 2013-06-30 (×4): 50 ug via INTRAVENOUS

## 2013-06-30 MED ORDER — MIDAZOLAM HCL 2 MG/2ML IJ SOLN: 1.0000 mg | INTRAMUSCULAR | Status: DC | PRN

## 2013-06-30 MED ORDER — IODIXANOL 320 MG/ML IV SOLN
100.0000 mL | Freq: Once | INTRAVENOUS | Status: AC | PRN
  Administered 2013-06-30: 1 mL via INTRAVENOUS

## 2013-06-30 MED ORDER — MORPHINE SULFATE 2 MG/ML IJ SOLN
1.0000 mg | INTRAMUSCULAR | Status: AC | PRN
  Administered 2013-06-30 (×2): 1 mg via INTRAVENOUS
  Filled 2013-06-30 (×2): qty 1

## 2013-06-30 MED ORDER — MIDAZOLAM HCL 2 MG/2ML IJ SOLN
INTRAMUSCULAR | Status: AC
  Filled 2013-06-30: qty 6

## 2013-06-30 MED ORDER — SODIUM CHLORIDE 0.9 % IJ SOLN
3.0000 mL | Freq: Two times a day (BID) | INTRAMUSCULAR | Status: DC
  Administered 2013-07-01: 3 mL via INTRAVENOUS

## 2013-06-30 MED ORDER — ONDANSETRON HCL 4 MG/2ML IJ SOLN: 4.0000 mg | Freq: Four times a day (QID) | INTRAMUSCULAR | Status: DC | PRN

## 2013-06-30 MED ORDER — MORPHINE SULFATE 2 MG/ML IJ SOLN
INTRAMUSCULAR | Status: AC
  Filled 2013-06-30: qty 1

## 2013-06-30 NOTE — Progress Notes (Signed)
ANTICOAGULATION CONSULT NOTE - Follow Up Consult  Pharmacy Consult for heparin Indication: DVTs/p venogram with tenecteplase  No Known Allergies  Patient Measurements: Height: 5\' 10"  (177.8 cm) Weight: 199 lb (90.266 kg) IBW/kg (Calculated) : 73 Heparin Dosing Weight: 90kg  Vital Signs: Temp: 99.1 F (37.3 C) (12/03 1500) Temp src: Oral (12/03 1500) BP: 137/70 mmHg (12/03 2000) Pulse Rate: 99 (12/03 2000)  Labs:  Recent Labs  06/29/13 1321 06/29/13 2100 06/30/13 0540 06/30/13 1145 06/30/13 1812 06/30/13 1930  HGB 16.7  --  14.7  --   --  14.7  HCT 48.4  --  43.3  --   --  42.2  PLT 131*  --  138*  --   --  133*  APTT 31  --   --  63* 51*  --   LABPROT 13.9  --  14.0  --   --   --   INR 1.09  --  1.10  --   --   --   HEPARINUNFRC  --  <0.10* 0.25*  --  <0.10*  --   CREATININE 0.72  --  0.71  --   --   --     Estimated Creatinine Clearance: 111 ml/min (by C-G formula based on Cr of 0.71).   Medications:  Infusions:  . sodium chloride 17 mL (06/30/13 1253)  . heparin 1,600 Units/hr (06/30/13 1600)  . tenecteplase (TNKase) infusion 0.2 mg/hr (06/30/13 1919)    Assessment: 62 yom presented to the hospital with a new LLE DVT. Now s/p venogram with thrombolytic infusion running. Was restarted on heparin with lower goal. Pts heparin level this AM had been 0.25 on 1600 units/hr, now a confirmation level is SUBtherapeutic at <0.1 units/mL. Level was drawn at 1815, but did not result until 2045- night RN told me he did not receive information about issues with the line or infusion being stopped from the day RN. No bleeding noted.  Goal of Therapy:  Heparin level 0.2-0.5 units/ml Monitor platelets by anticoagulation protocol: Yes   Plan:  1. Increase heparin to 1800 units/hr 2. Check a 6 hour heparin level 3. Continue daily heparin level and CBC 4. F/u transition plan to oral AC 5. Continue TNK as ordered per MD  Leotis Shames D. Machaela Caterino, PharmD, BCPS Clinical  Pharmacist Pager: [phone] 06/30/2013 8:52 PM

## 2013-06-30 NOTE — Procedures (Signed)
Procedure:  Left lower extremity venography; transcatheter thrombolytic therapy Access:  Left popliteal vein, 6 Fr sheath Findings:  Extensive LLE DVT from proximal calf up through iliac veins.  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  Thrombolytic infusion begun through 5 Fr infusion catheter with 50 cm infusion length.  Will begin infusion with Tenecteplace at dose of 0.4 mg/hr.  Will continue IV heparin. Plan:  Follow fibrinogen levels every 6 hrs during thrombolytic therapy.  Transfer to ICU.  Place Foley cath.  Recheck venogram tomorrow.

## 2013-06-30 NOTE — Progress Notes (Signed)
Pt with mild hematuria.  Dr Demetrius Charity.  Singh notified.  Call received from Dr Archer Asa.  Instruceted to decrease TNK to 0.2 mg/hr and send CBC now and Q 6 hours.  Will monitor for further bleeding/ clots.

## 2013-06-30 NOTE — Progress Notes (Signed)
Interventional Radiology Brief Progress Note  Call recieved that pt having some mild hematuria.  Red, but not bright red urine in foley bag, no clots.  Pt otherwise unchanged.  - CBC now and Q 6 hrs - Continue lysis for now but decrease TNK from 0.4 to 0.2 mg/hr - Instructed nurse to keep a close eye on the hematuria.  If this progresses at all we may have to cease the TNK.  Do not hesitate to page with questions, concerns or any change in patient status.   Signed,  Sterling Big, MD Vascular & Interventional Radiologist [facility] Pager: [phone]

## 2013-06-30 NOTE — Progress Notes (Signed)
[facility]                                                                                Patient Demographics  Omar Rogers, is a 62 y.o. male, DOB - [birth year], ZOX:[id number]  Admit date - 06/29/2013   Admitting Physician Kathlen Mody, MD  Outpatient Primary MD for the patient is No PCP Per Patient  LOS - 1   Chief Complaint  Patient presents with  . DVT        Assessment & Plan    1. Large left leg acute DVT (deep venous thrombosis) - was seen by vascular surgery in the office thereafter referred to IR for thrombectomy which was done on 06/30/2013 by Dr. Fredia Sorrow through IV TPA, IR patient is on heparin drip which will be continued for another 24 hours thereafter we will repeat venogram if stable switch him to oral anticoagulation agent like xaralto and sent home with outpatient hematology followup for hypercoagulable workup.   2. Hyponatremia from mild dehydration resolved with IV fluids.    3. Reactionary leukocytosis due to #1 above resolved.        Code Status: Full  Family Communication: None present  Disposition Plan: Home   Procedures left leg venogram with TPA injection by IR   Consults vascular surgery, IR   Medications  Scheduled Meds: . fentaNYL      . midazolam      . sodium chloride  3 mL Intravenous Q12H  . sodium chloride  3 mL Intravenous Q12H   Continuous Infusions: . sodium chloride    . heparin    . tenecteplase (TNKase) infusion 0.4 mg/hr (06/30/13 1035)   PRN Meds:.midazolam, morphine injection, morphine injection, ondansetron (ZOFRAN) IV, oxyCODONE, sodium chloride  DVT Prophylaxis  heparin  Lab Results  Component Value Date   PLT 138* 06/30/2013    Antibiotics    Anti-infectives   None          Subjective:   Omar Rogers today has, No headache, No chest pain, No abdominal pain - No Nausea, No new weakness tingling or numbness, No Cough - SOB. No left leg pain , improved swelling.  Objective:    Filed Vitals:   06/30/13 0950 06/30/13 0955 06/30/13 1000 06/30/13 1015  BP: 112/84 115/88 125/82 135/84  Pulse: 85 83 85 83  Temp:      TempSrc:      Resp: 11 14 13 11   Height:      Weight:      SpO2: 93% 94% 96% 96%    Wt Readings from Last 3 Encounters:  06/29/13 90.266 kg (199 lb)     Intake/Output Summary (Last 24 hours) at 06/30/13 1109 Last data filed at 06/30/13 0838  Gross per 24 hour  Intake    240 ml  Output    900 ml  Net   -660 ml    Exam Awake Alert, Oriented X 3, No new F.N deficits, Normal affect Omar Rogers.AT,PERRAL Supple Neck,No JVD, No cervical lymphadenopathy appriciated.  Symmetrical Chest wall movement, Good air movement bilaterally, CTAB RRR,No Gallops,Rubs or new Murmurs, No Parasternal Heave +ve B.Sounds, Abd Soft, Non tender, No organomegaly appriciated, No rebound -  guarding or rigidity. No Cyanosis, Clubbing or edema, No new Rash or bruise  L.Leg mildly swollen   Data Review   Micro Results No results found for this or any previous visit (from the past 240 hour(s)).  Radiology Reports No results found.  CBC  Recent Labs Lab 06/29/13 1321 06/30/13 0540  WBC 11.1* 8.8  HGB 16.7 14.7  HCT 48.4 43.3  PLT 131* 138*  MCV 106.4* 108.0*  MCH 36.7* 36.7*  MCHC 34.5 33.9  RDW 13.8 13.5  LYMPHSABS 1.1  --   MONOABS 1.6*  --   EOSABS 0.2  --   BASOSABS 0.0  --     Chemistries   Recent Labs Lab 06/29/13 1321 06/30/13 0540  NA 133* 139  K 4.8 3.7  CL 99 103  CO2 21 25  GLUCOSE 107* 100*  BUN 12 12  CREATININE 0.72 0.71  CALCIUM 8.6 8.3*  AST 41*  --   ALT 27  --   ALKPHOS 79  --   BILITOT 1.2  --    ------------------------------------------------------------------------------------------------------------------ estimated creatinine clearance is 111 ml/min (by C-G formula based on Cr of 0.71). ------------------------------------------------------------------------------------------------------------------ No results  found for this basename: HGBA1C,  in the last 72 hours ------------------------------------------------------------------------------------------------------------------ No results found for this basename: CHOL, HDL, LDLCALC, TRIG, CHOLHDL, LDLDIRECT,  in the last 72 hours ------------------------------------------------------------------------------------------------------------------ No results found for this basename: TSH, T4TOTAL, FREET3, T3FREE, THYROIDAB,  in the last 72 hours ------------------------------------------------------------------------------------------------------------------  Recent Labs  06/29/13 2100  VITAMINB12 451    Coagulation profile  Recent Labs Lab 06/29/13 1321 06/30/13 0540  INR 1.09 1.10    No results found for this basename: DDIMER,  in the last 72 hours  Cardiac Enzymes No results found for this basename: CK, CKMB, TROPONINI, MYOGLOBIN,  in the last 168 hours ------------------------------------------------------------------------------------------------------------------ No components found with this basename: POCBNP,      Time Spent in minutes  35   SINGH,PRASHANT K M.D on 06/30/2013 at 11:09 AM  Between 7am to 7pm - Pager - [phone]  After 7pm go to [email or web address] - password [email or web address]  And look for the night coverage person covering for me after hours  [facility] Office  [phone]

## 2013-06-30 NOTE — Progress Notes (Addendum)
ANTICOAGULATION CONSULT NOTE - Follow Up Consult  Pharmacy Consult for heparin Indication: DVTs/p venogram with tenecteplase  No Known Allergies  Patient Measurements: Height: 5\' 10"  (177.8 cm) Weight: 199 lb (90.266 kg) IBW/kg (Calculated) : 73 Heparin Dosing Weight: 90kg  Vital Signs: Temp: 98.4 F (36.9 C) (12/03 0349) Temp src: Oral (12/03 0349) BP: 135/84 mmHg (12/03 1015) Pulse Rate: 83 (12/03 1015)  Labs:  Recent Labs  06/29/13 1321 06/29/13 2100 06/30/13 0540  HGB 16.7  --  14.7  HCT 48.4  --  43.3  PLT 131*  --  138*  APTT 31  --   --   LABPROT 13.9  --  14.0  INR 1.09  --  1.10  HEPARINUNFRC  --  <0.10* 0.25*  CREATININE 0.72  --  0.71    Estimated Creatinine Clearance: 111 ml/min (by C-G formula based on Cr of 0.71).   Medications:  Infusions:  . sodium chloride    . heparin    . tenecteplase (TNKase) infusion      Assessment: 62 yom presented to the hospital with a new LLE DVT. Now s/p venogram with thrombolytic infusion running. To resume heparin gtt with lower heparin level goal. Pts heparin level this AM had been 0.25 on 1600 units/hr.  Goal of Therapy:  Heparin level 0.2-0.5 units/ml Monitor platelets by anticoagulation protocol: Yes   Plan:  1. Resume heparin at 1600 units/hr (currently running) 2. Check a 6 hour heparin level this afternoon 3. Continue daily heparin level and CBC 4. F/u transition plan to oral AC 5. Continue TNK as ordered per MD  Lysle Pearl, PharmD, BCPS Pager # [phone] 06/30/2013 10:57 AM

## 2013-06-30 NOTE — Progress Notes (Signed)
Patient began having shooting pain up his left leg/thigh this morning.  Was too soon to give Morphine IV per frequency of order and patient stated Oxy IR did not work for him.  Paged the MD on-call and received one-time dose of 2mg  Morphine IV and frequency of 1mg  prn was changed to q3h.  This dose worked well for the patient.  Will continue to monitor.  Omar Rogers

## 2013-06-30 NOTE — Progress Notes (Signed)
VVS Progress Note:  Left leg has DP pulse palp  Right leg is swollen, calf soft  To IR for Venogram

## 2013-07-01 ENCOUNTER — Inpatient Hospital Stay (HOSPITAL_COMMUNITY): Payer: Medicare Other

## 2013-07-01 LAB — HEMOGLOBIN AND HEMATOCRIT, BLOOD
HCT: 42.1 % (ref 39.0–52.0)
Hemoglobin: 14.5 g/dL (ref 13.0–17.0)
Hemoglobin: 14.4 g/dL (ref 13.0–17.0)

## 2013-07-01 LAB — CBC
HCT: 44.4 % (ref 39.0–52.0)
Hemoglobin: 14.6 g/dL (ref 13.0–17.0)
MCH: 36.4 pg — ABNORMAL HIGH (ref 26.0–34.0)
MCHC: 33.8 g/dL (ref 30.0–36.0)
MCHC: 34.3 g/dL (ref 30.0–36.0)
MCV: 106.2 fL — ABNORMAL HIGH (ref 78.0–100.0)
MCV: 108.8 fL — ABNORMAL HIGH (ref 78.0–100.0)
Platelets: 125 10*3/uL — ABNORMAL LOW (ref 150–400)
RBC: 4.01 MIL/uL — ABNORMAL LOW (ref 4.22–5.81)
RDW: 13.3 % (ref 11.5–15.5)

## 2013-07-01 LAB — HEPARIN LEVEL (UNFRACTIONATED): Heparin Unfractionated: 0.16 IU/mL — ABNORMAL LOW (ref 0.30–0.70)

## 2013-07-01 LAB — FIBRINOGEN
Fibrinogen: 585 mg/dL — ABNORMAL HIGH (ref 204–475)
Fibrinogen: 351 mg/dL (ref 204–475)

## 2013-07-01 LAB — APTT: aPTT: 92 seconds — ABNORMAL HIGH (ref 24–37)

## 2013-07-01 MED ORDER — RIVAROXABAN 20 MG PO TABS: 20.0000 mg | ORAL_TABLET | Freq: Every day | ORAL | Status: DC

## 2013-07-01 MED ORDER — IODIXANOL 320 MG/ML IV SOLN
100.0000 mL | Freq: Once | INTRAVENOUS | Status: AC | PRN
  Administered 2013-07-01: 100 mL via INTRAVENOUS

## 2013-07-01 MED ORDER — IOHEXOL 300 MG/ML  SOLN
100.0000 mL | Freq: Once | INTRAMUSCULAR | Status: AC | PRN
  Administered 2013-07-01: 1 mL via INTRAVENOUS

## 2013-07-01 MED ORDER — HEPARIN (PORCINE) IN NACL 100-0.45 UNIT/ML-% IJ SOLN
2100.0000 [IU]/h | INTRAMUSCULAR | Status: DC
  Filled 2013-07-01: qty 250

## 2013-07-01 MED ORDER — HEPARIN (PORCINE) IN NACL 100-0.45 UNIT/ML-% IJ SOLN
2300.0000 [IU]/h | INTRAMUSCULAR | Status: AC
  Filled 2013-07-01: qty 250

## 2013-07-01 MED ORDER — FENTANYL CITRATE 0.05 MG/ML IJ SOLN
INTRAMUSCULAR | Status: AC | PRN
  Administered 2013-07-01 (×4): 50 ug via INTRAVENOUS

## 2013-07-01 MED ORDER — FENTANYL CITRATE 0.05 MG/ML IJ SOLN
INTRAMUSCULAR | Status: AC
  Filled 2013-07-01: qty 4

## 2013-07-01 MED ORDER — MIDAZOLAM HCL 2 MG/2ML IJ SOLN
INTRAMUSCULAR | Status: AC
  Filled 2013-07-01: qty 6

## 2013-07-01 MED ORDER — RIVAROXABAN 15 MG PO TABS
15.0000 mg | ORAL_TABLET | Freq: Two times a day (BID) | ORAL | Status: DC
  Administered 2013-07-02: 15 mg via ORAL
  Filled 2013-07-01 (×3): qty 1

## 2013-07-01 MED ORDER — MIDAZOLAM HCL 2 MG/2ML IJ SOLN
INTRAMUSCULAR | Status: AC | PRN
  Administered 2013-07-01 (×5): 1 mg via INTRAVENOUS

## 2013-07-01 MED ORDER — RIVAROXABAN (XARELTO) EDUCATION KIT FOR DVT/PE PATIENTS
PACK | Freq: Once | Status: AC
  Administered 2013-07-02: 08:00:00
  Filled 2013-07-01: qty 1

## 2013-07-01 NOTE — Progress Notes (Signed)
ANTICOAGULATION CONSULT NOTE - Initial Consult  Pharmacy Consult for Xarelto Indication: DVT  No Known Allergies  Patient Measurements: Height: 5\' 10"  (177.8 cm) Weight: 199 lb (90.266 kg) IBW/kg (Calculated) : 73  Vital Signs: Temp: 98.2 F (36.8 C) (12/04 1900) Temp src: Oral (12/04 1900) BP: 130/75 mmHg (12/04 2300) Pulse Rate: 83 (12/04 2300)  Labs:  Recent Labs  06/29/13 1321  06/30/13 0540  06/30/13 1812 06/30/13 1930 07/01/13 0047 07/01/13 0300 07/01/13 0615 07/01/13 1130 07/01/13 1300 07/01/13 1900  HGB 16.7  --  14.7  --   --  14.7 14.6  --  15.0  --  14.5 14.4  HCT 48.4  --  43.3  --   --  42.2 42.6  --  44.4  --  42.1 42.0  PLT 131*  --  138*  --   --  133* 125*  --  129*  --   --   --   APTT 31  --   --   < > 51*  --  70*  --  78*  --  92*  --   LABPROT 13.9  --  14.0  --   --   --   --   --   --   --   --   --   INR 1.09  --  1.10  --   --   --   --   --   --   --   --   --   HEPARINUNFRC  --   < > 0.25*  --  <0.10*  --   --  0.16*  --  0.23*  --   --   CREATININE 0.72  --  0.71  --   --   --   --   --   --   --   --   --   < > = values in this interval not displayed.  Estimated Creatinine Clearance: 111 ml/min (by C-G formula based on Cr of 0.71).   Medical History: Past Medical History  Diagnosis Date  . DVT (deep venous thrombosis) 06/29/2013    extensive left iliofemoral popliteal DVT/notes (06/29/2013)  . Bipolar disorder     Medications:  Prescriptions prior to admission  Medication Sig Dispense Refill  . naproxen sodium (ANAPROX) 220 MG tablet Take 220 mg by mouth once.       Scheduled:  . fentaNYL      . midazolam      . sodium chloride  3 mL Intravenous Q12H   Infusions:  . sodium chloride 250 mL (07/01/13 2100)  . heparin 2,100 Units/hr (07/01/13 1930)    Assessment: 62yo male was admitted 12/2 and begun on heparin for DVT, then begun on thrombolysis w/ TNKase on 12/3, completed 12/4 pm, now to begin oral anticoagulation with  Xarelto.    Plan:  Will d/c heparin at 0800 and begin Xarelto 15mg  PO BID x21 days then 20mg  daily at supper.  Vernard Gambles, PharmD, BCPS  07/01/2013,11:51 PM

## 2013-07-01 NOTE — Progress Notes (Signed)
ANTICOAGULATION CONSULT NOTE - Follow Up Consult  Pharmacy Consult:  Heparin Indication:   New DVT, s/p venogram with tenecteplase  No Known Allergies  Patient Measurements: Height: 5\' 10"  (177.8 cm) Weight: 199 lb (90.266 kg) IBW/kg (Calculated) : 73 Heparin Dosing Weight: 90 kg  Vital Signs: Temp: 98.2 F (36.8 C) (12/04 0353) Temp src: Oral (12/04 0353) BP: 133/80 mmHg (12/04 0900) Pulse Rate: 94 (12/04 0900)  Labs:  Recent Labs  06/29/13 1321  06/30/13 0540  06/30/13 1812 06/30/13 1930 07/01/13 0047 07/01/13 0300 07/01/13 0615 07/01/13 1130  HGB 16.7  --  14.7  --   --  14.7 14.6  --  15.0  --   HCT 48.4  --  43.3  --   --  42.2 42.6  --  44.4  --   PLT 131*  --  138*  --   --  133* 125*  --  129*  --   APTT 31  --   --   < > 51*  --  70*  --  78*  --   LABPROT 13.9  --  14.0  --   --   --   --   --   --   --   INR 1.09  --  1.10  --   --   --   --   --   --   --   HEPARINUNFRC  --   < > 0.25*  --  <0.10*  --   --  0.16*  --  0.23*  CREATININE 0.72  --  0.71  --   --   --   --   --   --   --   < > = values in this interval not displayed.  Estimated Creatinine Clearance: 111 ml/min (by C-G formula based on Cr of 0.71).      Assessment: 62 YOM with new DVT, s/p venogram with TNKase initiation, to continue on IV heparin and TNKase.  Heparin level therapeutic but is toward the low end of goal.  No further bleeding post TNKase rate decreased yesterday.  Noted plan for repeat venogram and completion of thrombolytic therapy today.   Goal of Therapy:  HL 0.2-0.5 units/mL Monitor platelets by anticoagulation protocol: Yes    Plan:  - Increase heparin gtt slightly to 2100 units/hr - Daily HL / CBC - F/U post venogram and transition to oral [facility]    Maurisio Ruddy D. Laney Potash, PharmD, BCPS Pager:  [phone] 07/01/2013, 12:27 PM

## 2013-07-01 NOTE — Progress Notes (Signed)
ANTICOAGULATION CONSULT NOTE  Pharmacy Consult for heparin Indication: DVT   No Known Allergies  Patient Measurements: Height: 5\' 10"  (177.8 cm) Weight: 199 lb (90.266 kg) IBW/kg (Calculated) : 73 Heparin Dosing Weight: 90kg  Vital Signs: Temp: 98.2 F (36.8 C) (12/04 0353) Temp src: Oral (12/04 0353) BP: 124/75 mmHg (12/04 0300) Pulse Rate: 90 (12/04 0300)  Labs:  Recent Labs  06/29/13 1321  06/30/13 0540 06/30/13 1145 06/30/13 1812 06/30/13 1930 07/01/13 0047 07/01/13 0300  HGB 16.7  --  14.7  --   --  14.7 14.6  --   HCT 48.4  --  43.3  --   --  42.2 42.6  --   PLT 131*  --  138*  --   --  133* 125*  --   APTT 31  --   --  63* 51*  --  70*  --   LABPROT 13.9  --  14.0  --   --   --   --   --   INR 1.09  --  1.10  --   --   --   --   --   HEPARINUNFRC  --   < > 0.25*  --  <0.10*  --   --  0.16*  CREATININE 0.72  --  0.71  --   --   --   --   --   < > = values in this interval not displayed.  Estimated Creatinine Clearance: 111 ml/min (by C-G formula based on Cr of 0.71).  Assessment: 62 yo male with DVT s/p venogram, on TNKase infusion, for heparin.  Noted some hematuria requiring reduction of TNKase rate.  Goal of Therapy:  Heparin level 0.2-0.5 units/ml Monitor platelets by anticoagulation protocol: Yes   Plan:  Increase Heparin 2000 units/hr for now F/U after repeat venogram  Geannie Risen, PharmD, BCPS  07/01/2013 4:30 AM

## 2013-07-01 NOTE — Procedures (Signed)
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  Follow up venogram performed.  Residual thrombus in left femoral vein was treated with Angiojet thrombectomy device.  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  Follow up venogram performed and vascular sheath removed.   Findings: Minimal thrombus remaining in left femoral vein or left iliac veins.  Focal thrombus in left mid femoral vein was successfully treated with Angiojet.  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  The iliac stenosis was treated with PTA and stent placement.  Excellent venous flow in iliac veins after stent placement.   Complications: None     Condition: Good  Plan: Re-start heparin tonight.  Patient can transition to oral anticoagulation if desired by primary team tomorrow.  Patient will need compression stockings.

## 2013-07-01 NOTE — Progress Notes (Addendum)
Subjective: Pt resting in ICU. Feels better. S/p initiation of LLE thrombolysis. Thinks leg is less swollen and definitely less painful today. Developed some gross hematuria yesterday, TNK dose reduce to 0.2mg /hr   Objective: Physical Exam: BP 122/74  Pulse 80  Temp(Src) 98.2 F (36.8 C) (Oral)  Resp 18  Ht 5\' 10"  (1.778 m)  Wt 199 lb (90.266 kg)  BMI 28.55 kg/m2  SpO2 94% A&O X 3 Lungs: CTA Heart: Reg Abdomen: soft, ND, NT Ext: LLE still edematous 3+, NT. Softer Popliteal catheter intact, fossa soft, no hematoma Foot warm. Fibrinogen has remained >500  Labs: CBC  Recent Labs  07/01/13 0047 07/01/13 0615  WBC 9.3 10.3  HGB 14.6 15.0  HCT 42.6 44.4  PLT 125* 129*   BMET  Recent Labs  06/29/13 1321 06/30/13 0540  NA 133* 139  K 4.8 3.7  CL 99 103  CO2 21 25  GLUCOSE 107* 100*  BUN 12 12  CREATININE 0.72 0.71  CALCIUM 8.6 8.3*   LFT  Recent Labs  06/29/13 1321  PROT 6.5  ALBUMIN 3.3*  AST 41*  ALT 27  ALKPHOS 79  BILITOT 1.2   PT/INR  Recent Labs  06/29/13 1321 06/30/13 0540  LABPROT 13.9 14.0  INR 1.09 1.10     Studies/Results: Ir Veno/ext/uni Left  06/30/2013   CLINICAL DATA:  Extensive left lower extremity DVT and symptomatic edema. The patient presents for venography with probable transcatheter thrombolytic therapy.  EXAM: LEFT EXTREMITY VENOGRAPHY; INFERIOR VENA CAVOGRAM; IR ULTRASOUND GUIDANCE VASC ACCESS LEFT; RADIOLOGY EXAMINATION  1. ULTRASOUND GUIDANCE FOR VASCULAR ACCESS OF THE LEFT POPLITEAL VEIN 2. LEFT LOWER EXTREMITY VENOGRAPHY 3. IVC VENOGRAPHY 4. TRANSCATHETER THROMBOLYTIC INFUSION THERAPY FOR VENOUS THROMBOSIS  MEDICATIONS: Sedation:  4.0 mg IV Versed and and 200 mcg IV fentanyl.  Total Moderate Sedation Time:  35 minutes.  CONTRAST:  30 mL VISIPAQUE IODIXANOL 320 MG/ML IV SOLN  FLUOROSCOPY TIME:  5 min and 12 seconds.  PROCEDURE: Maximal sterile barrier technique was utilized including caps, mask, sterile gowns, sterile  gloves, sterile drape, hand hygiene and skin antiseptic. The left popliteal fossa was prepped with Betadine. Local anesthesia was provided with 1% lidocaine.  Ultrasound was performed of the left popliteal vein and proximal calf veins. Under ultrasound guidance, a 21 gauge needle was advanced into the popliteal vein along the lower margin of the popliteal fossa. After securing access with a micropuncture set, a guidewire was advanced. A 6 French vascular sheath was then placed over the guidewire.  A 5 French catheter was then advanced and periodic contrast injection performed of the left lower extremity deep veins. The catheter was further advanced over a guidewire to the level of the common iliac vein and additional venography performed. Contrast venography was also performed at the level of the inferior vena cava.  A multi side-hole 5 French infusion catheter was then advanced over the guidewire. Infusion length is 58 cm. Positioning of the catheter was documented by fluoroscopic spot images. Thrombolytic infusion was begun via the infusion catheter with Tenecteplase infusing at a dose of 0.4 milligrams/hour. Normal saline running at 10 mL on our was connected to the solid arm of the popliteal sheath. The sheath was secured in the popliteal fossa with a retention suture and adhesive dressing.  COMPLICATIONS: None.  FINDINGS: Sonography confirms the presence of extensive deep venous thrombosis involving the entire popliteal vein, femoral vein of the thigh, common femoral vein and external iliac vein. The proximal common iliac vein in  the pelvis demonstrates irregular stenosis and web formation with maximal degree of stenosis approaching 90%. 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 There likely is a component of chronic thrombus at the level of irregular stenosis. The inferior vena cava is widely patent.  Thrombolytic therapy was begun extending from the upper popliteal vein above the knee joint to the  level of the common iliac vein. Thrombolytic infusion will be continued overnight.  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 Thrombolytic infusion therapy was begun via a multi side hole infusion catheter extending from the popliteal vein to the common iliac vein. Infusion was begun with Tenecteplase infusing at a dose of 0.4 milligrams/hour. Concominant IV heparin will also be continued.   Electronically Signed   By: Irish Lack M.D.   On: 06/30/2013 12:18   Ir Caffie Damme Ivc  06/30/2013   CLINICAL DATA:  Extensive left lower extremity DVT and symptomatic edema. The patient presents for venography with probable transcatheter thrombolytic therapy.  EXAM: LEFT EXTREMITY VENOGRAPHY; INFERIOR VENA CAVOGRAM; IR ULTRASOUND GUIDANCE VASC ACCESS LEFT; RADIOLOGY EXAMINATION  1. ULTRASOUND GUIDANCE FOR VASCULAR ACCESS OF THE LEFT POPLITEAL VEIN 2. LEFT LOWER EXTREMITY VENOGRAPHY 3. IVC VENOGRAPHY 4. TRANSCATHETER THROMBOLYTIC INFUSION THERAPY FOR VENOUS THROMBOSIS  MEDICATIONS: Sedation:  4.0 mg IV Versed and and 200 mcg IV fentanyl.  Total Moderate Sedation Time:  35 minutes.  CONTRAST:  30 mL VISIPAQUE IODIXANOL 320 MG/ML IV SOLN  FLUOROSCOPY TIME:  5 min and 12 seconds.  PROCEDURE: Maximal sterile barrier technique was utilized including caps, mask, sterile gowns, sterile gloves, sterile drape, hand hygiene and skin antiseptic. The left popliteal fossa was prepped with Betadine. Local anesthesia was provided with 1% lidocaine.  Ultrasound was performed of the left popliteal vein and proximal calf veins. Under ultrasound guidance, a 21 gauge needle was advanced into the popliteal vein along the lower margin of the popliteal fossa. After securing access with a micropuncture set, a guidewire was advanced. A 6 French vascular  sheath was then placed over the guidewire.  A 5 French catheter was then advanced and periodic contrast injection performed of the left lower extremity deep veins. The catheter was further advanced over a guidewire to the level of the common iliac vein and additional venography performed. Contrast venography was also performed at the level of the inferior vena cava.  A multi side-hole 5 French infusion catheter was then advanced over the guidewire. Infusion length is 58 cm. Positioning of the catheter was documented by fluoroscopic spot images. Thrombolytic infusion was begun via the infusion catheter with Tenecteplase infusing at a dose of 0.4 milligrams/hour. Normal saline running at 10 mL on our was connected to the solid arm of the popliteal sheath. The sheath was secured in the popliteal fossa with a retention suture and adhesive dressing.  COMPLICATIONS: None.  FINDINGS: Sonography confirms the presence of extensive deep venous thrombosis involving the entire popliteal vein, femoral vein of the thigh, common femoral vein and external iliac vein. The proximal common iliac vein in the pelvis demonstrates irregular stenosis and web formation with maximal degree of stenosis approaching 90%. 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 There likely is a component of chronic thrombus at the level of irregular stenosis. The inferior vena cava is widely patent.  Thrombolytic therapy was begun extending from the upper popliteal vein above the knee joint  to the level of the common iliac vein. Thrombolytic infusion will be continued overnight.  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 Thrombolytic infusion therapy was begun via a multi side hole infusion catheter extending from the popliteal vein to the common iliac vein.  Infusion was begun with Tenecteplase infusing at a dose of 0.4 milligrams/hour. Concominant IV heparin will also be continued.   Electronically Signed   By: Irish Lack M.D.   On: 06/30/2013 12:18   Ir US Guide Vasc Access Left  06/30/2013   CLINICAL DATA:  Extensive left lower extremity DVT and symptomatic edema. The patient presents for venography with probable transcatheter thrombolytic therapy.  EXAM: LEFT EXTREMITY VENOGRAPHY; INFERIOR VENA CAVOGRAM; IR ULTRASOUND GUIDANCE VASC ACCESS LEFT; RADIOLOGY EXAMINATION  1. ULTRASOUND GUIDANCE FOR VASCULAR ACCESS OF THE LEFT POPLITEAL VEIN 2. LEFT LOWER EXTREMITY VENOGRAPHY 3. IVC VENOGRAPHY 4. TRANSCATHETER THROMBOLYTIC INFUSION THERAPY FOR VENOUS THROMBOSIS  MEDICATIONS: Sedation:  4.0 mg IV Versed and and 200 mcg IV fentanyl.  Total Moderate Sedation Time:  35 minutes.  CONTRAST:  30 mL VISIPAQUE IODIXANOL 320 MG/ML IV SOLN  FLUOROSCOPY TIME:  5 min and 12 seconds.  PROCEDURE: Maximal sterile barrier technique was utilized including caps, mask, sterile gowns, sterile gloves, sterile drape, hand hygiene and skin antiseptic. The left popliteal fossa was prepped with Betadine. Local anesthesia was provided with 1% lidocaine.  Ultrasound was performed of the left popliteal vein and proximal calf veins. Under ultrasound guidance, a 21 gauge needle was advanced into the popliteal vein along the lower margin of the popliteal fossa. After securing access with a micropuncture set, a guidewire was advanced. A 6 French vascular sheath was then placed over the guidewire.  A 5 French catheter was then advanced and periodic contrast injection performed of the left lower extremity deep veins. The catheter was further advanced over a guidewire to the level of the common iliac vein and additional venography performed. Contrast venography was also performed at the level of the inferior vena cava.  A multi side-hole 5 French infusion catheter was then advanced over the  guidewire. Infusion length is 58 cm. Positioning of the catheter was documented by fluoroscopic spot images. Thrombolytic infusion was begun via the infusion catheter with Tenecteplase infusing at a dose of 0.4 milligrams/hour. Normal saline running at 10 mL on our was connected to the solid arm of the popliteal sheath. The sheath was secured in the popliteal fossa with a retention suture and adhesive dressing.  COMPLICATIONS: None.  FINDINGS: Sonography confirms the presence of extensive deep venous thrombosis involving the entire popliteal vein, femoral vein of the thigh, common femoral vein and external iliac vein. The proximal common iliac vein in the pelvis demonstrates irregular stenosis and web formation with maximal degree of stenosis approaching 90%. 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 There likely is a component of chronic thrombus at the level of irregular stenosis. The inferior vena cava is widely patent.  Thrombolytic therapy was begun extending from the upper popliteal vein above the knee joint to the level of the common iliac vein. Thrombolytic infusion will be continued overnight.  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 Thrombolytic infusion therapy was begun via a multi  side hole infusion catheter extending from the popliteal vein to the common iliac vein. Infusion was begun with Tenecteplase infusing at a dose of 0.4 milligrams/hour. Concominant IV heparin will also be continued.   Electronically Signed   By: Irish Lack M.D.   On: 06/30/2013 12:18   Ir Infusion Thrombol Venous Initial (ms)  06/30/2013   CLINICAL DATA:  Extensive left lower extremity DVT and symptomatic edema. The patient presents for venography with probable transcatheter thrombolytic therapy.  EXAM: LEFT EXTREMITY  VENOGRAPHY; INFERIOR VENA CAVOGRAM; IR ULTRASOUND GUIDANCE VASC ACCESS LEFT; RADIOLOGY EXAMINATION  1. ULTRASOUND GUIDANCE FOR VASCULAR ACCESS OF THE LEFT POPLITEAL VEIN 2. LEFT LOWER EXTREMITY VENOGRAPHY 3. IVC VENOGRAPHY 4. TRANSCATHETER THROMBOLYTIC INFUSION THERAPY FOR VENOUS THROMBOSIS  MEDICATIONS: Sedation:  4.0 mg IV Versed and and 200 mcg IV fentanyl.  Total Moderate Sedation Time:  35 minutes.  CONTRAST:  30 mL VISIPAQUE IODIXANOL 320 MG/ML IV SOLN  FLUOROSCOPY TIME:  5 min and 12 seconds.  PROCEDURE: Maximal sterile barrier technique was utilized including caps, mask, sterile gowns, sterile gloves, sterile drape, hand hygiene and skin antiseptic. The left popliteal fossa was prepped with Betadine. Local anesthesia was provided with 1% lidocaine.  Ultrasound was performed of the left popliteal vein and proximal calf veins. Under ultrasound guidance, a 21 gauge needle was advanced into the popliteal vein along the lower margin of the popliteal fossa. After securing access with a micropuncture set, a guidewire was advanced. A 6 French vascular sheath was then placed over the guidewire.  A 5 French catheter was then advanced and periodic contrast injection performed of the left lower extremity deep veins. The catheter was further advanced over a guidewire to the level of the common iliac vein and additional venography performed. Contrast venography was also performed at the level of the inferior vena cava.  A multi side-hole 5 French infusion catheter was then advanced over the guidewire. Infusion length is 58 cm. Positioning of the catheter was documented by fluoroscopic spot images. Thrombolytic infusion was begun via the infusion catheter with Tenecteplase infusing at a dose of 0.4 milligrams/hour. Normal saline running at 10 mL on our was connected to the solid arm of the popliteal sheath. The sheath was secured in the popliteal fossa with a retention suture and adhesive dressing.  COMPLICATIONS: None.   FINDINGS: Sonography confirms the presence of extensive deep venous thrombosis involving the entire popliteal vein, femoral vein of the thigh, common femoral vein and external iliac vein. The proximal common iliac vein in the pelvis demonstrates irregular stenosis and web formation with maximal degree of stenosis approaching 90%. 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 There likely is a component of chronic thrombus at the level of irregular stenosis. The inferior vena cava is widely patent.  Thrombolytic therapy was begun extending from the upper popliteal vein above the knee joint to the level of the common iliac vein. Thrombolytic infusion will be continued overnight.  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 Thrombolytic infusion therapy was begun via a multi side hole infusion catheter extending from the popliteal vein to the common iliac vein. Infusion was begun with Tenecteplase infusing at a dose of 0.4 milligrams/hour. Concominant IV heparin will also be continued.   Electronically Signed   By: Irish Lack M.D.   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 D/w Pt, consent signed in chart    LOS: 2 days    Brayton El PA-C 07/01/2013 9:04 AM

## 2013-07-01 NOTE — Progress Notes (Signed)
Patient transported to IR without event. Patient remained on telemetry through transport, all vital signs stable on arrival. Report handed off to RN in IR. RN will call when transferring patient back.   Wyn Quaker RN

## 2013-07-01 NOTE — Progress Notes (Signed)
ANTICOAGULATION CONSULT NOTE - Follow Up Consult  Pharmacy Consult for heparin Indication: New DVT, s/p venogram with tenecteplase  No Known Allergies  Patient Measurements: Height: 5\' 10"  (177.8 cm) Weight: 199 lb (90.266 kg) IBW/kg (Calculated) : 73 Heparin Dosing Weight: 90kg  Vital Signs: BP: 135/86 mmHg (12/04 1625) Pulse Rate: 88 (12/04 1625)  Labs:  Recent Labs  06/29/13 1321  06/30/13 0540  06/30/13 1812 06/30/13 1930 07/01/13 0047 07/01/13 0300 07/01/13 0615 07/01/13 1130 07/01/13 1300  HGB 16.7  --  14.7  --   --  14.7 14.6  --  15.0  --  14.5  HCT 48.4  --  43.3  --   --  42.2 42.6  --  44.4  --  42.1  PLT 131*  --  138*  --   --  133* 125*  --  129*  --   --   APTT 31  --   --   < > 51*  --  70*  --  78*  --  92*  LABPROT 13.9  --  14.0  --   --   --   --   --   --   --   --   INR 1.09  --  1.10  --   --   --   --   --   --   --   --   HEPARINUNFRC  --   < > 0.25*  --  <0.10*  --   --  0.16*  --  0.23*  --   CREATININE 0.72  --  0.71  --   --   --   --   --   --   --   --   < > = values in this interval not displayed.  Estimated Creatinine Clearance: 111 ml/min (by C-G formula based on Cr of 0.71).  Assessment: 36 YOM with new DVT, s/p venogram with TNKase initiation 12/3 with a repeat venogram done today. Received orders for heparin to be held until 1900 tonight- has been turned off by RN in IR already. TNK was turned off before patient went to IR per Trinna Post RN on 39M. Heparin level this morning was therapeutic, but at lower end of goal at 0.23 units/mL. on gtt at 2000 units/hr. RPh this morning increased heparin to 2100 units/hr. No bleeding noted.  Goal of Therapy:  Heparin level 0.2-0.5 units/ml Monitor platelets by anticoagulation protocol: Yes   Plan:  1. Resume heparin 2100 units/hr tonight at 1900Trinna Post RN on 39M understands and will do so before giving report this evening 2. Will check a 6 hour HL after heparin is reumed 3. Follow up for transition  to oral anticoagulation  Lineth Thielke D. Melaysia Streed, PharmD, BCPS Clinical Pharmacist Pager: [phone] 07/01/2013 4:50 PM

## 2013-07-01 NOTE — Progress Notes (Signed)
[facility]                                                                                Patient Demographics  Omar Rogers, is a 62 y.o. male, DOB - May 21, 1951, ZOX:[id number]  Admit date - 06/29/2013   Admitting Physician Kathlen Mody, MD  Outpatient Primary MD for the patient is No PCP Per Patient  LOS - 2   Chief Complaint  Patient presents with  . DVT        Assessment & Plan     1. Large left leg acute DVT (deep venous thrombosis) - was seen by vascular surgery in the office thereafter referred to IR for thrombectomy which was done on 06/30/2013 by Dr. Fredia Sorrow through IV TPA, patient is on heparin drip & TPA infusion managed by IR, we will repeat venogram today, also okay by IR we will switch him to oral anticoagulation agent like xaralto and sent home with outpatient hematology followup for hypercoagulable workup. Discussed with oncologist Dr. Rosie Fate on 06/30/2013.     2. Hyponatremia from mild dehydration resolved with IV fluids.      3. Reactionary leukocytosis due to #1 above resolved.       4. Mild hematuria. TPA dose has been reduced by IR, H&H is stable, will continue to monitor hematuria, urine is getting lighter in color. Irrigate Foley every 4 hours .      Code Status: Full  Family Communication: None present  Disposition Plan: Home   Procedures left leg venogram with TPA injection by IR   Consults vascular surgery, IR   Medications  Scheduled Meds: . sodium chloride  3 mL Intravenous Q12H  . sodium chloride  3 mL Intravenous Q12H   Continuous Infusions: . sodium chloride 17 mL (06/30/13 1253)  . heparin 2,100 Units/hr (07/01/13 1230)  . tenecteplase (TNKase) infusion 0.2 mg/hr (07/01/13 0700)   PRN Meds:.ondansetron (ZOFRAN) IV, oxyCODONE, sodium chloride  DVT Prophylaxis  heparin  Lab Results  Component Value Date   PLT 129* 07/01/2013    Antibiotics    Anti-infectives   None          Subjective:    [name] today has, No headache, No chest pain, No abdominal pain - No Nausea, No new weakness tingling or numbness, No Cough - SOB. No left leg pain , improved swelling.  Objective:   Filed Vitals:   07/01/13 0600 07/01/13 0700 07/01/13 0800 07/01/13 0900  BP: 117/72 122/74 116/70 133/80  Pulse: 84 80 83 94  Temp:      TempSrc:      Resp: 15 18 13 25   Height:      Weight:      SpO2: 96% 94% 92% 96%    Wt Readings from Last 3 Encounters:  06/29/13 90.266 kg (199 lb)     Intake/Output Summary (Last 24 hours) at 07/01/13 1238 Last data filed at 07/01/13 1200  Gross per 24 hour  Intake 1842.98 ml  Output   1725 ml  Net 117.98 ml    Exam Awake Alert, Oriented X 3, No new F.N deficits, Normal affect [name].AT,PERRAL Supple Neck,No JVD, No cervical lymphadenopathy appriciated.  Symmetrical Chest  wall movement, Good air movement bilaterally, CTAB RRR,No Gallops,Rubs or new Murmurs, No Parasternal Heave +ve B.Sounds, Abd Soft, Non tender, No organomegaly appriciated, No rebound - guarding or rigidity. No Cyanosis, Clubbing or edema, No new Rash or bruise  L.Leg mildly swollen   Data Review   Micro Results Recent Results (from the past 240 hour(s))  MRSA PCR SCREENING     Status: None   Collection Time    06/30/13 11:01 AM      Result Value Range Status   MRSA by PCR NEGATIVE  NEGATIVE Final   Comment:            The GeneXpert MRSA Assay (FDA     approved for NASAL specimens     only), is one component of a     comprehensive MRSA colonization     surveillance program. It is not     intended to diagnose MRSA     infection nor to guide or     monitor treatment for     MRSA infections.    Radiology Reports No results found.  CBC  Recent Labs Lab 06/29/13 1321 06/30/13 0540 06/30/13 1930 07/01/13 0047 07/01/13 0615  WBC 11.1* 8.8 10.2 9.3 10.3  HGB 16.7 14.7 14.7 14.6 15.0  HCT 48.4 43.3 42.2 42.6 44.4  PLT 131* 138* 133* 125* 129*  MCV 106.4*  108.0* 107.9* 106.2* 108.8*  MCH 36.7* 36.7* 37.6* 36.4* 36.8*  MCHC 34.5 33.9 34.8 34.3 33.8  RDW 13.8 13.5 13.4 13.2 13.3  LYMPHSABS 1.1  --   --   --   --   MONOABS 1.6*  --   --   --   --   EOSABS 0.2  --   --   --   --   BASOSABS 0.0  --   --   --   --     Chemistries   Recent Labs Lab 06/29/13 1321 06/30/13 0540  NA 133* 139  K 4.8 3.7  CL 99 103  CO2 21 25  GLUCOSE 107* 100*  BUN 12 12  CREATININE 0.72 0.71  CALCIUM 8.6 8.3*  AST 41*  --   ALT 27  --   ALKPHOS 79  --   BILITOT 1.2  --    ------------------------------------------------------------------------------------------------------------------ estimated creatinine clearance is 111 ml/min (by C-G formula based on Cr of 0.71). ------------------------------------------------------------------------------------------------------------------ No results found for this basename: HGBA1C,  in the last 72 hours ------------------------------------------------------------------------------------------------------------------ No results found for this basename: CHOL, HDL, LDLCALC, TRIG, CHOLHDL, LDLDIRECT,  in the last 72 hours ------------------------------------------------------------------------------------------------------------------ No results found for this basename: TSH, T4TOTAL, FREET3, T3FREE, THYROIDAB,  in the last 72 hours ------------------------------------------------------------------------------------------------------------------  Recent Labs  06/29/13 2100  VITAMINB12 451    Coagulation profile  Recent Labs Lab 06/29/13 1321 06/30/13 0540  INR 1.09 1.10    No results found for this basename: DDIMER,  in the last 72 hours  Cardiac Enzymes No results found for this basename: CK, CKMB, TROPONINI, MYOGLOBIN,  in the last 168 hours ------------------------------------------------------------------------------------------------------------------ No components found with this basename:  POCBNP,      Time Spent in minutes  35   Kasten Leveque K M.D on 07/01/2013 at 12:38 PM  Between 7am to 7pm - Pager - [phone]  After 7pm go to [email or web address] - password [email or web address]  And look for the night coverage person covering for me after hours  [facility] Office  [phone]

## 2013-07-01 NOTE — ED Notes (Signed)
Hep drip stopped per MD instructions

## 2013-07-02 ENCOUNTER — Encounter (HOSPITAL_COMMUNITY): Payer: Self-pay | Admitting: General Practice

## 2013-07-02 DIAGNOSIS — I871 Compression of vein: Secondary | ICD-10-CM

## 2013-07-02 HISTORY — DX: Compression of vein: I87.1

## 2013-07-02 LAB — CBC
HCT: 39.9 % (ref 39.0–52.0)
Hemoglobin: 13.8 g/dL (ref 13.0–17.0)
MCH: 36.8 pg — ABNORMAL HIGH (ref 26.0–34.0)
MCHC: 34.6 g/dL (ref 30.0–36.0)
MCV: 106.4 fL — ABNORMAL HIGH (ref 78.0–100.0)
Platelets: 133 10*3/uL — ABNORMAL LOW (ref 150–400)
RBC: 3.75 MIL/uL — ABNORMAL LOW (ref 4.22–5.81)

## 2013-07-02 LAB — PLASMINOGEN ACTIVITY: Plasminogen: 106 % (ref 65–176)

## 2013-07-02 LAB — BASIC METABOLIC PANEL
BUN: 9 mg/dL (ref 6–23)
CO2: 28 mEq/L (ref 19–32)
Calcium: 8.3 mg/dL — ABNORMAL LOW (ref 8.4–10.5)
Creatinine, Ser: 0.9 mg/dL (ref 0.50–1.35)
Glucose, Bld: 102 mg/dL — ABNORMAL HIGH (ref 70–99)

## 2013-07-02 MED ORDER — OXYCODONE HCL 5 MG PO TABS: 5.0000 mg | ORAL_TABLET | ORAL | Status: DC | PRN

## 2013-07-02 MED ORDER — RIVAROXABAN 15 MG PO TABS: 15.0000 mg | ORAL_TABLET | Freq: Two times a day (BID) | ORAL | Status: DC

## 2013-07-02 NOTE — Discharge Summary (Signed)
[facility]                                                                                   Omar Rogers, is a 61 y.o. male  DOB 1950-08-15  MRN [id number].  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 DVT (deep venous thrombosis) 06/29/2013    extensive left iliofemoral popliteal DVT/notes (06/29/2013)  . Bipolar disorder     Past Surgical History  Procedure Laterality Date  . Ankle fracture surgery Right ~ 2012     Discharge Condition: stable   Follow-up Information   Follow up with [facility]    . Schedule an appointment as soon as possible for a visit in 1 week.   Contact information:   [address] [place] [address] [phone]      Follow up with Basilio Cairo, Lala Lund, MD. Schedule an appointment as soon as possible for a visit in 1 week.   Specialty:  Vascular Surgery   Contact information:   [address] [place] [address] [phone]       Follow up with CHISM, DAVID, MD. Schedule an appointment as soon as possible for a visit in 1 week.   Specialty:  Internal Medicine   Contact information:   [address] [place] [address] [phone]         Consults obtained - IR, Vascular, Oncology Dr Rosie Fate over the phone4   Discharge Medications      Medication List         naproxen sodium 220 MG tablet  Commonly known as:  ANAPROX  Take 220 mg by mouth  once.     oxyCODONE 5 MG immediate release tablet  Commonly known as:  Oxy IR/ROXICODONE  Take 1 tablet (5 mg total) by mouth every 4 (four) hours as needed for moderate pain.     Rivaroxaban 15 MG Tabs tablet  Commonly known as:  XARELTO  Take 1 tablet (15 mg total) by mouth 2 (two) times daily with a meal. Then get dose adjusted by PCP         Diet and Activity recommendation: See Discharge Instructions below   Discharge Instructions     Follow with Primary MD in 7 days   Get CBC, CMP, checked 7 days by Primary MD and again as instructed by your Primary MD.   Get Medicines reviewed and adjusted.  Please request your Prim.MD to go over all Hospital Tests and Procedure/Radiological results at the follow up, please get all Hospital records sent to your Prim MD  by signing hospital release before you go home.  Activity: As tolerated with Full fall precautions use walker/cane & assistance as needed   Diet:  Heart Healthy  For Heart failure patients - Check your Weight same time everyday, if you gain over 2 pounds, or you develop in leg swelling, experience more shortness of breath or chest pain, call your Primary MD immediately. Follow Cardiac Low Salt Diet and 1.8 lit/day fluid restriction.  Disposition Home    If you experience worsening of your admission symptoms, develop shortness of breath, life threatening emergency, suicidal or homicidal thoughts you must seek medical attention immediately by calling 911 or calling your MD immediately  if symptoms less severe.  You Must read complete instructions/literature along with all the possible adverse reactions/side effects for all the Medicines you take and that have been prescribed to you. Take any new Medicines after you have completely understood and accpet all the possible adverse reactions/side effects.   Do not drive and provide baby sitting services if your were admitted for syncope or siezures until you have seen by Primary  MD or a Neurologist and advised to do so again.  Do not drive when taking Pain medications.    Do not take more than prescribed Pain, Sleep and Anxiety Medications  Special Instructions: If you have smoked or chewed Tobacco  in the last 2 yrs please stop smoking, stop any regular Alcohol  and or any Recreational drug use.  Wear Seat belts while driving.   Please note  You were cared for by a hospitalist during your hospital stay. If you have any questions about your discharge medications or the care you received while you were in the hospital after you are discharged, you can call the unit and asked to speak with the hospitalist on call if the hospitalist that took care of you is not available. Once you are discharged, your primary care physician will handle any further medical issues. Please note that NO REFILLS for any discharge medications will be authorized once you are discharged, as it is imperative that you return to your primary care physician (or establish a relationship with a primary care physician if you do not have one) for your aftercare needs so that they can reassess your need for medications and monitor your lab values.    Major procedures and Radiology Reports - PLEASE review detailed and final reports for all details, in brief -       Ir Veno/ext/uni Left  06/30/2013   CLINICAL DATA:  Extensive left lower extremity DVT and symptomatic edema. The patient presents for venography with probable transcatheter thrombolytic therapy.  EXAM: LEFT EXTREMITY VENOGRAPHY; INFERIOR VENA CAVOGRAM; IR ULTRASOUND GUIDANCE VASC ACCESS LEFT; RADIOLOGY EXAMINATION  1. ULTRASOUND GUIDANCE FOR VASCULAR ACCESS OF THE LEFT POPLITEAL VEIN 2. LEFT LOWER EXTREMITY VENOGRAPHY 3. IVC VENOGRAPHY 4. TRANSCATHETER THROMBOLYTIC INFUSION THERAPY FOR VENOUS THROMBOSIS  MEDICATIONS: Sedation:  4.0 mg IV Versed and and 200 mcg IV fentanyl.  Total Moderate Sedation Time:  35 minutes.  CONTRAST:  30 mL VISIPAQUE  IODIXANOL 320 MG/ML IV SOLN  FLUOROSCOPY TIME:  5 min and 12 seconds.  PROCEDURE: Maximal sterile barrier technique was utilized including caps, mask, sterile gowns, sterile gloves, sterile drape, hand hygiene and skin antiseptic. The left popliteal fossa was prepped with Betadine. Local anesthesia was provided with 1% lidocaine.  Ultrasound was performed of the left popliteal vein and proximal calf veins. Under ultrasound guidance, a 21 gauge needle was advanced into the popliteal  vein along the lower margin of the popliteal fossa. After securing access with a micropuncture set, a guidewire was advanced. A 6 French vascular sheath was then placed over the guidewire.  A 5 French catheter was then advanced and periodic contrast injection performed of the left lower extremity deep veins. The catheter was further advanced over a guidewire to the level of the common iliac vein and additional venography performed. Contrast venography was also performed at the level of the inferior vena cava.  A multi side-hole 5 French infusion catheter was then advanced over the guidewire. Infusion length is 58 cm. Positioning of the catheter was documented by fluoroscopic spot images. Thrombolytic infusion was begun via the infusion catheter with Tenecteplase infusing at a dose of 0.4 milligrams/hour. Normal saline running at 10 mL on our was connected to the solid arm of the popliteal sheath. The sheath was secured in the popliteal fossa with a retention suture and adhesive dressing.  COMPLICATIONS: None.  FINDINGS: Sonography confirms the presence of extensive deep venous thrombosis involving the entire popliteal vein, femoral vein of the thigh, common femoral vein and external iliac vein. The proximal common iliac vein in the pelvis demonstrates irregular stenosis and web formation with maximal degree of stenosis approaching 90%. 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 There likely is a component of chronic thrombus at  the level of irregular stenosis. The inferior vena cava is widely patent.  Thrombolytic therapy was begun extending from the upper popliteal vein above the knee joint to the level of the common iliac vein. Thrombolytic infusion will be continued overnight.  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 Thrombolytic infusion therapy was begun via a multi side hole infusion catheter extending from the popliteal vein to the common iliac vein. Infusion was begun with Tenecteplase infusing at a dose of 0.4 milligrams/hour. Concominant IV heparin will also be continued.   Electronically Signed   By: Irish Lack M.D.   On: 06/30/2013 12:18   Ir Caffie Damme Ivc  06/30/2013   CLINICAL DATA:  Extensive left lower extremity DVT and symptomatic edema. The patient presents for venography with probable transcatheter thrombolytic therapy.  EXAM: LEFT EXTREMITY VENOGRAPHY; INFERIOR VENA CAVOGRAM; IR ULTRASOUND GUIDANCE VASC ACCESS LEFT; RADIOLOGY EXAMINATION  1. ULTRASOUND GUIDANCE FOR VASCULAR ACCESS OF THE LEFT POPLITEAL VEIN 2. LEFT LOWER EXTREMITY VENOGRAPHY 3. IVC VENOGRAPHY 4. TRANSCATHETER THROMBOLYTIC INFUSION THERAPY FOR VENOUS THROMBOSIS  MEDICATIONS: Sedation:  4.0 mg IV Versed and and 200 mcg IV fentanyl.  Total Moderate Sedation Time:  35 minutes.  CONTRAST:  30 mL VISIPAQUE IODIXANOL 320 MG/ML IV SOLN  FLUOROSCOPY TIME:  5 min and 12 seconds.  PROCEDURE: Maximal sterile barrier technique was utilized including caps, mask, sterile gowns, sterile gloves, sterile drape, hand hygiene and skin antiseptic. The left popliteal fossa was prepped with Betadine. Local anesthesia was provided with 1% lidocaine.  Ultrasound was performed of the left popliteal vein and proximal calf veins. Under ultrasound guidance, a 21 gauge needle was  advanced into the popliteal vein along the lower margin of the popliteal fossa. After securing access with a micropuncture set, a guidewire was advanced. A 6 French vascular sheath was then placed over the guidewire.  A 5 French catheter was then advanced and periodic contrast injection performed of the left lower extremity deep veins. The catheter was further advanced over a  guidewire to the level of the common iliac vein and additional venography performed. Contrast venography was also performed at the level of the inferior vena cava.  A multi side-hole 5 French infusion catheter was then advanced over the guidewire. Infusion length is 58 cm. Positioning of the catheter was documented by fluoroscopic spot images. Thrombolytic infusion was begun via the infusion catheter with Tenecteplase infusing at a dose of 0.4 milligrams/hour. Normal saline running at 10 mL on our was connected to the solid arm of the popliteal sheath. The sheath was secured in the popliteal fossa with a retention suture and adhesive dressing.  COMPLICATIONS: None.  FINDINGS: Sonography confirms the presence of extensive deep venous thrombosis involving the entire popliteal vein, femoral vein of the thigh, common femoral vein and external iliac vein. The proximal common iliac vein in the pelvis demonstrates irregular stenosis and web formation with maximal degree of stenosis approaching 90%. 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 There likely is a component of chronic thrombus at the level of irregular stenosis. The inferior vena cava is widely patent.  Thrombolytic therapy was begun extending from the upper popliteal vein above the knee joint to the level of the common iliac vein. Thrombolytic infusion will be continued overnight.  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 Appearance is  consistent with May Thurner syndrome. Thrombolytic infusion therapy was begun via a multi side hole infusion catheter extending from the popliteal vein to the common iliac vein. Infusion was begun with Tenecteplase infusing at a dose of 0.4 milligrams/hour. Concominant IV heparin will also be continued.   Electronically Signed   By: Irish Lack M.D.   On: 06/30/2013 12:18   Ir Thrombect Prim Mech Add (inclu) Mod Sed  07/01/2013   CLINICAL DATA:  62 year old with left lower extremity DVT and symptomatic edema. Catheter directed thrombolysis was started on 06/30/2013. Patient is scheduled for follow-up venogram.  EXAM: VENOGRAM TO FOLLOW-UP CATHETER DIRECTED THROMBOLYSIS; THROMBECTOMY WITH ANGIOJET DEVICE IN LEFT FEMORAL VEIN; PLACEMENT OF METALLIC STENT IN LEFT COMMON ILIAC VEIN.  Physician: Rachelle Hora. Henn, MD  MEDICATIONS: Versed 5 mg, fentanyl 200 mcg. A radiology nurse monitored the patient for moderate sedation.  ANESTHESIA/SEDATION: Moderate sedation time: 60 min  FLUOROSCOPY TIME:  13 min and 14 seconds  PROCEDURE: The procedure was explained to the patient. The risks and benefits of the procedure were discussed and the patient's questions were addressed. Informed consent was obtained from the patient. The patient was placed prone. The infusion catheter located in the back of the left knee was prepped and draped in a sterile fashion. Maximal barrier sterile technique was utilized including caps, mask, sterile gowns, sterile gloves, sterile drape, hand hygiene and skin antiseptic. Contrast was initially injected through the infusion catheter. The catheter was removed over a Bentson wire and additional imaging was obtained with a 5 [place] catheter. The 6 French vascular sheath was exchanged for a 7 [place], 55 cm sheath. The residual thrombus in the mid left femoral vein was treated with the Angiojet thrombectomy device. Follow up venography was performed. Additional venography was performed in the pelvis.  The left common iliac vein stenosis was treated with a 12 mm x 40 mm balloon. Follow-up venography was performed. The common iliac vein stenosis was then treated with a 14 mm x 80 mm Smart stent. The stent was successfully deployed with fluoroscopy. Follow-up venography was performed.  Distal aspect of the stent was dilated with a 12 mm x 40 mm balloon. Final venography was performed. The vascular sheath was removed with manual compression. A dressing was placed over the old catheter site.  COMPLICATIONS: None  FINDINGS: The initial venography demonstrated that the left iliac veins were patent but there was a critical stenosis in the proximal left common iliac artery. 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 There was a short segment of residual clot within the left femoral vein. The thrombosed segment was successfully treated with the Angiojet thrombectomy device. Flow was restored within this short segment. There was poor flow through the left common iliac vein. The flow did improve after balloon dilatation with the 12 mm balloon. A 14 mm stent was placed in the left common iliac vein and the proximal aspect of the stent extends into the distal IVC. This stent was widely patent at the end of the procedure and there was excellent flow through the iliac venous system at the end of the procedure.  IMPRESSION: Completion of catheter-directed thrombolysis to the left lower extremity. There is good flow through the left femoral vein and left iliac venous system. Small amount of nonocclusive thrombus remains.  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 Successful treatment of the left common iliac vein stenosis with balloon angioplasty and placement of a self-expanding metallic stent. Excellent flow through the left iliac venous system following these procedures.   Electronically Signed   By: Richarda Overlie M.D.   On: 07/01/2013 17:52   Ir US Guide Vasc Access  Left  06/30/2013   CLINICAL DATA:  Extensive left lower extremity DVT and symptomatic edema. The patient presents for venography with probable transcatheter thrombolytic therapy.  EXAM: LEFT EXTREMITY VENOGRAPHY; INFERIOR VENA CAVOGRAM; IR ULTRASOUND GUIDANCE VASC ACCESS LEFT; RADIOLOGY EXAMINATION  1. ULTRASOUND GUIDANCE FOR VASCULAR ACCESS OF THE LEFT POPLITEAL VEIN 2. LEFT LOWER EXTREMITY VENOGRAPHY 3. IVC VENOGRAPHY 4. TRANSCATHETER THROMBOLYTIC INFUSION THERAPY FOR VENOUS THROMBOSIS  MEDICATIONS: Sedation:  4.0 mg IV Versed and and 200 mcg IV fentanyl.  Total Moderate Sedation Time:  35 minutes.  CONTRAST:  30 mL VISIPAQUE IODIXANOL 320 MG/ML IV SOLN  FLUOROSCOPY TIME:  5 min and 12 seconds.  PROCEDURE: Maximal sterile barrier technique was utilized including caps, mask, sterile gowns, sterile gloves, sterile drape, hand hygiene and skin antiseptic. The left popliteal fossa was prepped with Betadine. Local anesthesia was provided with 1% lidocaine.  Ultrasound was performed of the left popliteal vein and proximal calf veins. Under ultrasound guidance, a 21 gauge needle was advanced into the popliteal vein along the lower margin of the popliteal fossa. After securing access with a micropuncture set, a guidewire was advanced. A 6 French vascular sheath was then placed over the guidewire.  A 5 French catheter was then advanced and periodic contrast injection performed of the left lower extremity deep veins. The catheter was further advanced over a guidewire to the level of the common iliac vein and additional venography performed. Contrast venography was also performed at the level of the inferior vena cava.  A multi side-hole 5 French infusion catheter was then advanced over the guidewire. Infusion length is 58 cm. Positioning of the catheter was documented by fluoroscopic spot images. Thrombolytic infusion was begun via the infusion catheter with Tenecteplase infusing at a dose of 0.4 milligrams/hour. Normal  saline running at 10 mL on our was connected to the solid arm of the popliteal sheath. The sheath  was secured in the popliteal fossa with a retention suture and adhesive dressing.  COMPLICATIONS: None.  FINDINGS: Sonography confirms the presence of extensive deep venous thrombosis involving the entire popliteal vein, femoral vein of the thigh, common femoral vein and external iliac vein. The proximal common iliac vein in the pelvis demonstrates irregular stenosis and web formation with maximal degree of stenosis approaching 90%. 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 There likely is a component of chronic thrombus at the level of irregular stenosis. The inferior vena cava is widely patent.  Thrombolytic therapy was begun extending from the upper popliteal vein above the knee joint to the level of the common iliac vein. Thrombolytic infusion will be continued overnight.  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 Thrombolytic infusion therapy was begun via a multi side hole infusion catheter extending from the popliteal vein to the common iliac vein. Infusion was begun with Tenecteplase infusing at a dose of 0.4 milligrams/hour. Concominant IV heparin will also be continued.   Electronically Signed   By: Irish Lack M.D.   On: 06/30/2013 12:18   Ir [employer] Stent  Initial Vein  Inc Angioplasty  07/01/2013   CLINICAL DATA:  62 year old with left lower extremity DVT and symptomatic edema. Catheter directed thrombolysis was started on 06/30/2013. Patient is scheduled for follow-up venogram.  EXAM: VENOGRAM TO FOLLOW-UP CATHETER DIRECTED THROMBOLYSIS; THROMBECTOMY WITH ANGIOJET DEVICE IN LEFT FEMORAL VEIN; PLACEMENT OF METALLIC STENT IN LEFT COMMON ILIAC VEIN.  Physician: Rachelle Hora. Henn, MD  MEDICATIONS: Versed 5  mg, fentanyl 200 mcg. A radiology nurse monitored the patient for moderate sedation.  ANESTHESIA/SEDATION: Moderate sedation time: 60 min  FLUOROSCOPY TIME:  13 min and 14 seconds  PROCEDURE: The procedure was explained to the patient. The risks and benefits of the procedure were discussed and the patient's questions were addressed. Informed consent was obtained from the patient. The patient was placed prone. The infusion catheter located in the back of the left knee was prepped and draped in a sterile fashion. Maximal barrier sterile technique was utilized including caps, mask, sterile gowns, sterile gloves, sterile drape, hand hygiene and skin antiseptic. Contrast was initially injected through the infusion catheter. The catheter was removed over a Bentson wire and additional imaging was obtained with a 5 [place] catheter. The 6 French vascular sheath was exchanged for a 7 [place], 55 cm sheath. The residual thrombus in the mid left femoral vein was treated with the Angiojet thrombectomy device. Follow up venography was performed. Additional venography was performed in the pelvis. The left common iliac vein stenosis was treated with a 12 mm x 40 mm balloon. Follow-up venography was performed. The common iliac vein stenosis was then treated with a 14 mm x 80 mm Smart stent. The stent was successfully deployed with fluoroscopy. Follow-up venography was performed. Distal aspect of the stent was dilated with a 12 mm x 40 mm balloon. Final venography was performed. The vascular sheath was removed with manual compression. A dressing was placed over the old catheter site.  COMPLICATIONS: None  FINDINGS: The initial venography demonstrated that the left iliac veins were patent but there was a critical stenosis in the proximal left common iliac artery. 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 There was a short segment of residual clot within the left femoral vein. The thrombosed segment was successfully treated  with the Angiojet thrombectomy device. Flow was restored within this short segment. There was poor flow through the left common iliac vein. The flow did improve after balloon dilatation with the 12 mm balloon. A 14 mm stent was placed in the left common iliac vein and the proximal aspect of the stent extends into the distal IVC. This stent was widely patent at the end of the procedure and there was excellent flow through the iliac venous system at the end of the procedure.  IMPRESSION: Completion of catheter-directed thrombolysis to the left lower extremity. There is good flow through the left femoral vein and left iliac venous system. Small amount of nonocclusive thrombus remains.  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 Successful treatment of the left common iliac vein stenosis with balloon angioplasty and placement of a self-expanding metallic stent. Excellent flow through the left iliac venous system following these procedures.   Electronically Signed   By: Richarda Overlie M.D.   On: 07/01/2013 17:52   Ir Infusion Thrombol Venous Initial (ms)  06/30/2013   CLINICAL DATA:  Extensive left lower extremity DVT and symptomatic edema. The patient presents for venography with probable transcatheter thrombolytic therapy.  EXAM: LEFT EXTREMITY VENOGRAPHY; INFERIOR VENA CAVOGRAM; IR ULTRASOUND GUIDANCE VASC ACCESS LEFT; RADIOLOGY EXAMINATION  1. ULTRASOUND GUIDANCE FOR VASCULAR ACCESS OF THE LEFT POPLITEAL VEIN 2. LEFT LOWER EXTREMITY VENOGRAPHY 3. IVC VENOGRAPHY 4. TRANSCATHETER THROMBOLYTIC INFUSION THERAPY FOR VENOUS THROMBOSIS  MEDICATIONS: Sedation:  4.0 mg IV Versed and and 200 mcg IV fentanyl.  Total Moderate Sedation Time:  35 minutes.  CONTRAST:  30 mL VISIPAQUE IODIXANOL 320 MG/ML IV SOLN  FLUOROSCOPY TIME:  5 min and 12 seconds.  PROCEDURE: Maximal sterile barrier technique was utilized including caps, mask, sterile gowns, sterile gloves, sterile drape, hand hygiene  and skin antiseptic. The left popliteal fossa was prepped with Betadine. Local anesthesia was provided with 1% lidocaine.  Ultrasound was performed of the left popliteal vein and proximal calf veins. Under ultrasound guidance, a 21 gauge needle was advanced into the popliteal vein along the lower margin of the popliteal fossa. After securing access with a micropuncture set, a guidewire was advanced. A 6 French vascular sheath was then placed over the guidewire.  A 5 French catheter was then advanced and periodic contrast injection performed of the left lower extremity deep veins. The catheter was further advanced over a guidewire to the level of the common iliac vein and additional venography performed. Contrast venography was also performed at the level of the inferior vena cava.  A multi side-hole 5 French infusion catheter was then advanced over the guidewire. Infusion length is 58 cm. Positioning of the catheter was documented by fluoroscopic spot images. Thrombolytic infusion was begun via the infusion catheter with Tenecteplase infusing at a dose of 0.4 milligrams/hour. Normal saline running at 10 mL on our was connected to the solid arm of the popliteal sheath. The sheath was secured in the popliteal fossa with a retention suture and adhesive dressing.  COMPLICATIONS: None.  FINDINGS: Sonography confirms the presence of extensive deep venous thrombosis involving the entire popliteal vein, femoral vein of the thigh, common femoral vein and external iliac vein. The proximal common iliac vein in the pelvis demonstrates irregular stenosis and web formation with maximal degree of stenosis approaching 90%. 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 There likely is a component of chronic thrombus at the level of irregular stenosis. The inferior vena cava is widely  patent.  Thrombolytic therapy was begun extending from the upper popliteal vein above the knee joint to the level of the common iliac vein.  Thrombolytic infusion will be continued overnight.  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 Thrombolytic infusion therapy was begun via a multi side hole infusion catheter extending from the popliteal vein to the common iliac vein. Infusion was begun with Tenecteplase infusing at a dose of 0.4 milligrams/hour. Concominant IV heparin will also be continued.   Electronically Signed   By: Irish Lack M.D.   On: 06/30/2013 12:18   Ir Rande Lawman F/u Eval Art/ven Final Day (ms)  07/01/2013   CLINICAL DATA:  62 year old with left lower extremity DVT and symptomatic edema. Catheter directed thrombolysis was started on 06/30/2013. Patient is scheduled for follow-up venogram.  EXAM: VENOGRAM TO FOLLOW-UP CATHETER DIRECTED THROMBOLYSIS; THROMBECTOMY WITH ANGIOJET DEVICE IN LEFT FEMORAL VEIN; PLACEMENT OF METALLIC STENT IN LEFT COMMON ILIAC VEIN.  Physician: Rachelle Hora. Henn, MD  MEDICATIONS: Versed 5 mg, fentanyl 200 mcg. A radiology nurse monitored the patient for moderate sedation.  ANESTHESIA/SEDATION: Moderate sedation time: 60 min  FLUOROSCOPY TIME:  13 min and 14 seconds  PROCEDURE: The procedure was explained to the patient. The risks and benefits of the procedure were discussed and the patient's questions were addressed. Informed consent was obtained from the patient. The patient was placed prone. The infusion catheter located in the back of the left knee was prepped and draped in a sterile fashion. Maximal barrier sterile technique was utilized including caps, mask, sterile gowns, sterile gloves, sterile drape, hand hygiene and skin antiseptic. Contrast was initially injected through the infusion catheter. The catheter was removed over a Bentson wire and additional imaging was obtained with a 5 [place] catheter. The 6 French  vascular sheath was exchanged for a 7 [place], 55 cm sheath. The residual thrombus in the mid left femoral vein was treated with the Angiojet thrombectomy device. Follow up venography was performed. Additional venography was performed in the pelvis. The left common iliac vein stenosis was treated with a 12 mm x 40 mm balloon. Follow-up venography was performed. The common iliac vein stenosis was then treated with a 14 mm x 80 mm Smart stent. The stent was successfully deployed with fluoroscopy. Follow-up venography was performed. Distal aspect of the stent was dilated with a 12 mm x 40 mm balloon. Final venography was performed. The vascular sheath was removed with manual compression. A dressing was placed over the old catheter site.  COMPLICATIONS: None  FINDINGS: The initial venography demonstrated that the left iliac veins were patent but there was a critical stenosis in the proximal left common iliac artery. 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 There was a short segment of residual clot within the left femoral vein. The thrombosed segment was successfully treated with the Angiojet thrombectomy device. Flow was restored within this short segment. There was poor flow through the left common iliac vein. The flow did improve after balloon dilatation with the 12 mm balloon. A 14 mm stent was placed in the left common iliac vein and the proximal aspect of the stent extends into the distal IVC. This stent was widely patent at the end of the procedure and there was excellent flow through the iliac venous system at the end of the procedure.  IMPRESSION: Completion of catheter-directed thrombolysis to the  left lower extremity. There is good flow through the left femoral vein and left iliac venous system. Small amount of nonocclusive thrombus remains.  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 Successful treatment of the left common iliac vein stenosis with  balloon angioplasty and placement of a self-expanding metallic stent. Excellent flow through the left iliac venous system following these procedures.   Electronically Signed   By: Richarda Overlie M.D.   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 Follow up venogram performed. Residual thrombus in left femoral vein was treated with Angiojet thrombectomy device. 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 Follow up venogram performed and vascular sheath removed.    Findings: Minimal thrombus remaining in left femoral vein or left iliac veins. Focal thrombus in left mid femoral vein was successfully treated with Angiojet. 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 The iliac stenosis was treated with PTA and stent placement. Excellent venous flow in iliac veins after stent placement.    Complications: None  Condition: Good    Plan: Re-start heparin tonight. Patient can transition to oral anticoagulation if desired by primary team tomorrow. Patient will need compression stockings.     Micro Results      Recent Results (from the past 240 hour(s))  MRSA PCR SCREENING     Status: None   Collection Time    06/30/13 11:01 AM      Result Value Range Status   MRSA by PCR NEGATIVE  NEGATIVE Final   Comment:            The GeneXpert MRSA Assay (FDA     approved for NASAL specimens     only), is one component of a     comprehensive MRSA colonization     surveillance program. It is not     intended to diagnose MRSA     infection nor to guide or      monitor treatment for     MRSA infections.     History of present illness and  Hospital Course:     Kindly see H&P for history of present illness and admission details, please review complete Labs, Consult reports and Test reports for all details in brief Omar Rogers, is a 62 y.o. male, patient with no past Medical Problems was sent from vascular surgery office of Dr. 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   She will now be placed on xaralto hopefully for 3-6 months as he had a reversible cause of his DVT i.e.May-Thurner syndrome which has been stented, he will  however follow outpatient with vascular surgery, hematology one time. He will also follow with his PCP which [facility], social work has been consulted to assist the patient with followup appointments obtaining xaralto.   Rosanne Ashing today symptom-free, his left neck swelling has almost completely resolved and also has hematuria which is now minimal to completely resolved. He does not have any chest symptoms i.e. no chest pain, palpitations or shortness of breath. He is eager to go home.    Today   Subjective:   Omar Rogers today has no headache,no chest abdominal pain,no new weakness tingling or numbness, feels much better wants to go home today.    Objective:   Blood pressure 117/68, pulse 74, temperature 98.1 F (36.7 C), temperature source Oral, resp. rate 18, height 5\' 10"  (1.778 m), weight 91.4 kg (201 lb 8 oz), SpO2  97.00%.   Intake/Output Summary (Last 24 hours) at 07/02/13 0952 Last data filed at 07/02/13 0800  Gross per 24 hour  Intake 1442.7 ml  Output   2735 ml  Net -1292.3 ml    Exam Awake Alert, Oriented *3, No new F.N deficits, Normal affect [place].AT,PERRAL Supple Neck,No JVD, No cervical lymphadenopathy appriciated.  Symmetrical Chest wall movement, Good air movement bilaterally, CTAB RRR,No Gallops,Rubs or new Murmurs, No Parasternal Heave +ve B.Sounds, Abd Soft, Non tender, No organomegaly appriciated, No rebound -guarding or rigidity. No Cyanosis, Clubbing or edema, No new Rash or bruise, mild L leg swelling  Data Review   CBC w Diff: Lab Results  Component Value Date   WBC 8.8 07/02/2013   HGB 13.8 07/02/2013   HCT 39.9 07/02/2013   PLT 133* 07/02/2013   LYMPHOPCT 9* 06/29/2013   MONOPCT 15* 06/29/2013   EOSPCT 1 06/29/2013   BASOPCT 0 06/29/2013    CMP: Lab Results  Component Value Date   NA 135 07/02/2013   K 3.7 07/02/2013   CL 99 07/02/2013   CO2 28 07/02/2013   BUN 9 07/02/2013   CREATININE 0.90 07/02/2013   PROT 6.5 06/29/2013   ALBUMIN 3.3* 06/29/2013   BILITOT 1.2 06/29/2013   ALKPHOS 79 06/29/2013   AST 41* 06/29/2013   ALT 27 06/29/2013  .   Total Time in preparing paper work, data evaluation and todays exam - 35 minutes  Leroy Sea M.D on 07/02/2013 at 9:52 AM  [facility] Office  [phone]

## 2013-07-02 NOTE — Progress Notes (Signed)
ANTICOAGULATION CONSULT NOTE - Follow Up Consult  Pharmacy Consult for heparin Indication: DVT  Labs:  Recent Labs  06/29/13 1321  06/30/13 0540  06/30/13 1930 07/01/13 0047 07/01/13 0300 07/01/13 0615 07/01/13 1130 07/01/13 1300 07/01/13 1900 07/02/13 0100  HGB 16.7  --  14.7  --  14.7 14.6  --  15.0  --  14.5 14.4  --   HCT 48.4  --  43.3  --  42.2 42.6  --  44.4  --  42.1 42.0  --   PLT 131*  --  138*  --  133* 125*  --  129*  --   --   --   --   APTT 31  --   --   < >  --  70*  --  78*  --  92*  --   --   LABPROT 13.9  --  14.0  --   --   --   --   --   --   --   --   --   INR 1.09  --  1.10  --   --   --   --   --   --   --   --   --   HEPARINUNFRC  --   < > 0.25*  < >  --   --  0.16*  --  0.23*  --   --  0.17*  CREATININE 0.72  --  0.71  --   --   --   --   --   --   --   --   --   < > = values in this interval not displayed.   Assessment: 62yo male subtherapeutic on heparin after resumed s/p TNKase.  Goal of Therapy:  Heparin level 0.3-0.5 units/ml (for initial 24hr after TNKase off)   Plan:  Will increase heparin gtt by 2 units/kg/hr to 2300 units/hr until planned to be off at 0800 when first dose of Xarelto is given.  Vernard Gambles, PharmD, BCPS  07/02/2013,1:43 AM

## 2013-07-02 NOTE — Progress Notes (Signed)
Subjective: LLE venous lysis 12/3-4 Pt has done well Says left leg is definitely less swollen and less painful  Objective: Vital signs in last 24 hours: Temp:  [98.1 F (36.7 C)-98.4 F (36.9 C)] 98.4 F (36.9 C) (12/05 0352) Pulse Rate:  [77-100] 79 (12/05 0600) Resp:  [11-31] 22 (12/05 0500) BP: (82-142)/(44-99) 120/66 mmHg (12/05 0600) SpO2:  [92 %-98 %] 92 % (12/05 0600) Weight:  [201 lb 8 oz (91.4 kg)] 201 lb 8 oz (91.4 kg) (12/05 0352) Last BM Date: 06/29/13  Intake/Output from previous day: 12/04 0701 - 12/05 0700 In: 1459 [P.O.:720; I.V.:599; IV Piggyback:140] Out: 2525 [Urine:2525] Intake/Output this shift:    PE:  Afeb; VSS H/H: 13.8/39.9 (14.4/42) plts 133 (129) LLE: slightly larger than Rt NT to palpate leg; sl tender to touch lysis site- L popliteal area- no hematoma 2+ pulses at Left foot Pink and warm UOP good- less bloody per pt  Lab Results:   Recent Labs  07/01/13 0615  07/01/13 1900 07/02/13 0400  WBC 10.3  --   --  8.8  HGB 15.0  < > 14.4 13.8  HCT 44.4  < > 42.0 39.9  PLT 129*  --   --  133*  < > = values in this interval not displayed. BMET  Recent Labs  06/30/13 0540 07/02/13 0400  NA 139 135  K 3.7 3.7  CL 103 99  CO2 25 28  GLUCOSE 100* 102*  BUN 12 9  CREATININE 0.71 0.90  CALCIUM 8.3* 8.3*   PT/INR  Recent Labs  06/29/13 1321 06/30/13 0540  LABPROT 13.9 14.0  INR 1.09 1.10   ABG No results found for this basename: PHART, PCO2, PO2, HCO3,  in the last 72 hours  Studies/Results: Ir Veno/ext/uni Left  06/30/2013   CLINICAL DATA:  Extensive left lower extremity DVT and symptomatic edema. The patient presents for venography with probable transcatheter thrombolytic therapy.  EXAM: LEFT EXTREMITY VENOGRAPHY; INFERIOR VENA CAVOGRAM; IR ULTRASOUND GUIDANCE VASC ACCESS LEFT; RADIOLOGY EXAMINATION  1. ULTRASOUND GUIDANCE FOR VASCULAR ACCESS OF THE LEFT POPLITEAL VEIN 2. LEFT LOWER EXTREMITY VENOGRAPHY 3. IVC VENOGRAPHY 4.  TRANSCATHETER THROMBOLYTIC INFUSION THERAPY FOR VENOUS THROMBOSIS  MEDICATIONS: Sedation:  4.0 mg IV Versed and and 200 mcg IV fentanyl.  Total Moderate Sedation Time:  35 minutes.  CONTRAST:  30 mL VISIPAQUE IODIXANOL 320 MG/ML IV SOLN  FLUOROSCOPY TIME:  5 min and 12 seconds.  PROCEDURE: Maximal sterile barrier technique was utilized including caps, mask, sterile gowns, sterile gloves, sterile drape, hand hygiene and skin antiseptic. The left popliteal fossa was prepped with Betadine. Local anesthesia was provided with 1% lidocaine.  Ultrasound was performed of the left popliteal vein and proximal calf veins. Under ultrasound guidance, a 21 gauge needle was advanced into the popliteal vein along the lower margin of the popliteal fossa. After securing access with a micropuncture set, a guidewire was advanced. A 6 French vascular sheath was then placed over the guidewire.  A 5 French catheter was then advanced and periodic contrast injection performed of the left lower extremity deep veins. The catheter was further advanced over a guidewire to the level of the common iliac vein and additional venography performed. Contrast venography was also performed at the level of the inferior vena cava.  A multi side-hole 5 French infusion catheter was then advanced over the guidewire. Infusion length is 58 cm. Positioning of the catheter was documented by fluoroscopic spot images. Thrombolytic infusion was begun via the infusion catheter with  Tenecteplase infusing at a dose of 0.4 milligrams/hour. Normal saline running at 10 mL on our was connected to the solid arm of the popliteal sheath. The sheath was secured in the popliteal fossa with a retention suture and adhesive dressing.  COMPLICATIONS: None.  FINDINGS: Sonography confirms the presence of extensive deep venous thrombosis involving the entire popliteal vein, femoral vein of the thigh, common femoral vein and external iliac vein. The proximal common iliac vein in the  pelvis demonstrates irregular stenosis and web formation with maximal degree of stenosis approaching 90%. 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 There likely is a component of chronic thrombus at the level of irregular stenosis. The inferior vena cava is widely patent.  Thrombolytic therapy was begun extending from the upper popliteal vein above the knee joint to the level of the common iliac vein. Thrombolytic infusion will be continued overnight.  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 Thrombolytic infusion therapy was begun via a multi side hole infusion catheter extending from the popliteal vein to the common iliac vein. Infusion was begun with Tenecteplase infusing at a dose of 0.4 milligrams/hour. Concominant IV heparin will also be continued.   Electronically Signed   By: Irish Lack M.D.   On: 06/30/2013 12:18   Ir Caffie Damme Ivc  06/30/2013   CLINICAL DATA:  Extensive left lower extremity DVT and symptomatic edema. The patient presents for venography with probable transcatheter thrombolytic therapy.  EXAM: LEFT EXTREMITY VENOGRAPHY; INFERIOR VENA CAVOGRAM; IR ULTRASOUND GUIDANCE VASC ACCESS LEFT; RADIOLOGY EXAMINATION  1. ULTRASOUND GUIDANCE FOR VASCULAR ACCESS OF THE LEFT POPLITEAL VEIN 2. LEFT LOWER EXTREMITY VENOGRAPHY 3. IVC VENOGRAPHY 4. TRANSCATHETER THROMBOLYTIC INFUSION THERAPY FOR VENOUS THROMBOSIS  MEDICATIONS: Sedation:  4.0 mg IV Versed and and 200 mcg IV fentanyl.  Total Moderate Sedation Time:  35 minutes.  CONTRAST:  30 mL VISIPAQUE IODIXANOL 320 MG/ML IV SOLN  FLUOROSCOPY TIME:  5 min and 12 seconds.  PROCEDURE: Maximal sterile barrier technique was utilized including caps, mask, sterile gowns, sterile gloves, sterile drape, hand hygiene and skin antiseptic. The left popliteal  fossa was prepped with Betadine. Local anesthesia was provided with 1% lidocaine.  Ultrasound was performed of the left popliteal vein and proximal calf veins. Under ultrasound guidance, a 21 gauge needle was advanced into the popliteal vein along the lower margin of the popliteal fossa. After securing access with a micropuncture set, a guidewire was advanced. A 6 French vascular sheath was then placed over the guidewire.  A 5 French catheter was then advanced and periodic contrast injection performed of the left lower extremity deep veins. The catheter was further advanced over a guidewire to the level of the common iliac vein and additional venography performed. Contrast venography was also performed at the level of the inferior vena cava.  A multi side-hole 5 French infusion catheter was then advanced over the guidewire. Infusion length is 58 cm. Positioning of the catheter was documented by fluoroscopic spot images. Thrombolytic infusion was begun via the infusion catheter with Tenecteplase infusing at a dose of 0.4 milligrams/hour. Normal saline running at 10 mL on our was connected to the solid arm of the popliteal sheath. The sheath was secured in the popliteal fossa with a retention suture and adhesive dressing.  COMPLICATIONS: None.  FINDINGS: Sonography confirms the presence of extensive deep venous thrombosis involving the entire  popliteal vein, femoral vein of the thigh, common femoral vein and external iliac vein. The proximal common iliac vein in the pelvis demonstrates irregular stenosis and web formation with maximal degree of stenosis approaching 90%. 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 There likely is a component of chronic thrombus at the level of irregular stenosis. The inferior vena cava is widely patent.  Thrombolytic therapy was begun extending from the upper popliteal vein above the knee joint to the level of the common iliac vein. Thrombolytic infusion will be continued  overnight.  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 Thrombolytic infusion therapy was begun via a multi side hole infusion catheter extending from the popliteal vein to the common iliac vein. Infusion was begun with Tenecteplase infusing at a dose of 0.4 milligrams/hour. Concominant IV heparin will also be continued.   Electronically Signed   By: Irish Lack M.D.   On: 06/30/2013 12:18   Ir Thrombect Prim Mech Add (inclu) Mod Sed  07/01/2013   CLINICAL DATA:  62 year old with left lower extremity DVT and symptomatic edema. Catheter directed thrombolysis was started on 06/30/2013. Patient is scheduled for follow-up venogram.  EXAM: VENOGRAM TO FOLLOW-UP CATHETER DIRECTED THROMBOLYSIS; THROMBECTOMY WITH ANGIOJET DEVICE IN LEFT FEMORAL VEIN; PLACEMENT OF METALLIC STENT IN LEFT COMMON ILIAC VEIN.  Physician: Rachelle Hora. Henn, MD  MEDICATIONS: Versed 5 mg, fentanyl 200 mcg. A radiology nurse monitored the patient for moderate sedation.  ANESTHESIA/SEDATION: Moderate sedation time: 60 min  FLUOROSCOPY TIME:  13 min and 14 seconds  PROCEDURE: The procedure was explained to the patient. The risks and benefits of the procedure were discussed and the patient's questions were addressed. Informed consent was obtained from the patient. The patient was placed prone. The infusion catheter located in the back of the left knee was prepped and draped in a sterile fashion. Maximal barrier sterile technique was utilized including caps, mask, sterile gowns, sterile gloves, sterile drape, hand hygiene and skin antiseptic. Contrast was initially injected through the infusion catheter. The catheter was removed over a Bentson wire and additional imaging was obtained with a 5 [place] catheter. The 6 French vascular sheath was exchanged for a 7 [place],  55 cm sheath. The residual thrombus in the mid left femoral vein was treated with the Angiojet thrombectomy device. Follow up venography was performed. Additional venography was performed in the pelvis. The left common iliac vein stenosis was treated with a 12 mm x 40 mm balloon. Follow-up venography was performed. The common iliac vein stenosis was then treated with a 14 mm x 80 mm Smart stent. The stent was successfully deployed with fluoroscopy. Follow-up venography was performed. Distal aspect of the stent was dilated with a 12 mm x 40 mm balloon. Final venography was performed. The vascular sheath was removed with manual compression. A dressing was placed over the old catheter site.  COMPLICATIONS: None  FINDINGS: The initial venography demonstrated that the left iliac veins were patent but there was a critical stenosis in the proximal left common iliac artery. 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 There was a short segment of residual clot within the left femoral vein. The thrombosed segment was successfully treated with the Angiojet thrombectomy device. Flow was restored within this short segment. There was poor flow through the left common iliac vein. The flow did improve after balloon dilatation with the  12 mm balloon. A 14 mm stent was placed in the left common iliac vein and the proximal aspect of the stent extends into the distal IVC. This stent was widely patent at the end of the procedure and there was excellent flow through the iliac venous system at the end of the procedure.  IMPRESSION: Completion of catheter-directed thrombolysis to the left lower extremity. There is good flow through the left femoral vein and left iliac venous system. Small amount of nonocclusive thrombus remains.  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 Successful treatment of the left common iliac vein stenosis with balloon angioplasty and placement of a  self-expanding metallic stent. Excellent flow through the left iliac venous system following these procedures.   Electronically Signed   By: Richarda Overlie M.D.   On: 07/01/2013 17:52   Ir US Guide Vasc Access Left  06/30/2013   CLINICAL DATA:  Extensive left lower extremity DVT and symptomatic edema. The patient presents for venography with probable transcatheter thrombolytic therapy.  EXAM: LEFT EXTREMITY VENOGRAPHY; INFERIOR VENA CAVOGRAM; IR ULTRASOUND GUIDANCE VASC ACCESS LEFT; RADIOLOGY EXAMINATION  1. ULTRASOUND GUIDANCE FOR VASCULAR ACCESS OF THE LEFT POPLITEAL VEIN 2. LEFT LOWER EXTREMITY VENOGRAPHY 3. IVC VENOGRAPHY 4. TRANSCATHETER THROMBOLYTIC INFUSION THERAPY FOR VENOUS THROMBOSIS  MEDICATIONS: Sedation:  4.0 mg IV Versed and and 200 mcg IV fentanyl.  Total Moderate Sedation Time:  35 minutes.  CONTRAST:  30 mL VISIPAQUE IODIXANOL 320 MG/ML IV SOLN  FLUOROSCOPY TIME:  5 min and 12 seconds.  PROCEDURE: Maximal sterile barrier technique was utilized including caps, mask, sterile gowns, sterile gloves, sterile drape, hand hygiene and skin antiseptic. The left popliteal fossa was prepped with Betadine. Local anesthesia was provided with 1% lidocaine.  Ultrasound was performed of the left popliteal vein and proximal calf veins. Under ultrasound guidance, a 21 gauge needle was advanced into the popliteal vein along the lower margin of the popliteal fossa. After securing access with a micropuncture set, a guidewire was advanced. A 6 French vascular sheath was then placed over the guidewire.  A 5 French catheter was then advanced and periodic contrast injection performed of the left lower extremity deep veins. The catheter was further advanced over a guidewire to the level of the common iliac vein and additional venography performed. Contrast venography was also performed at the level of the inferior vena cava.  A multi side-hole 5 French infusion catheter was then advanced over the guidewire. Infusion length is  58 cm. Positioning of the catheter was documented by fluoroscopic spot images. Thrombolytic infusion was begun via the infusion catheter with Tenecteplase infusing at a dose of 0.4 milligrams/hour. Normal saline running at 10 mL on our was connected to the solid arm of the popliteal sheath. The sheath was secured in the popliteal fossa with a retention suture and adhesive dressing.  COMPLICATIONS: None.  FINDINGS: Sonography confirms the presence of extensive deep venous thrombosis involving the entire popliteal vein, femoral vein of the thigh, common femoral vein and external iliac vein. The proximal common iliac vein in the pelvis demonstrates irregular stenosis and web formation with maximal degree of stenosis approaching 90%. 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 There likely is a component of chronic thrombus at the level of irregular stenosis. The inferior vena cava is widely patent.  Thrombolytic therapy was begun extending from the upper popliteal vein above the knee joint to the level of the common iliac vein. Thrombolytic infusion will be continued overnight.  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 Thrombolytic infusion therapy was begun via a multi side hole infusion catheter extending from the popliteal vein to the common iliac vein. Infusion was begun with Tenecteplase infusing at a dose of 0.4 milligrams/hour. Concominant IV heparin will also be continued.   Electronically Signed   By: Irish Lack M.D.   On: 06/30/2013 12:18   Ir [facility] [employer] Stent  Initial Vein  Inc Angioplasty  07/01/2013   CLINICAL DATA:  62 year old with left lower extremity DVT and symptomatic edema. Catheter directed thrombolysis was started on 06/30/2013. Patient is scheduled for follow-up venogram.  EXAM: VENOGRAM TO  FOLLOW-UP CATHETER DIRECTED THROMBOLYSIS; THROMBECTOMY WITH ANGIOJET DEVICE IN LEFT FEMORAL VEIN; PLACEMENT OF METALLIC STENT IN LEFT COMMON ILIAC VEIN.  Physician: Rachelle Hora. Henn, MD  MEDICATIONS: Versed 5 mg, fentanyl 200 mcg. A radiology nurse monitored the patient for moderate sedation.  ANESTHESIA/SEDATION: Moderate sedation time: 60 min  FLUOROSCOPY TIME:  13 min and 14 seconds  PROCEDURE: The procedure was explained to the patient. The risks and benefits of the procedure were discussed and the patient's questions were addressed. Informed consent was obtained from the patient. The patient was placed prone. The infusion catheter located in the back of the left knee was prepped and draped in a sterile fashion. Maximal barrier sterile technique was utilized including caps, mask, sterile gowns, sterile gloves, sterile drape, hand hygiene and skin antiseptic. Contrast was initially injected through the infusion catheter. The catheter was removed over a Bentson wire and additional imaging was obtained with a 5 [place] catheter. The 6 French vascular sheath was exchanged for a 7 [place], 55 cm sheath. The residual thrombus in the mid left femoral vein was treated with the Angiojet thrombectomy device. Follow up venography was performed. Additional venography was performed in the pelvis. The left common iliac vein stenosis was treated with a 12 mm x 40 mm balloon. Follow-up venography was performed. The common iliac vein stenosis was then treated with a 14 mm x 80 mm Smart stent. The stent was successfully deployed with fluoroscopy. Follow-up venography was performed. Distal aspect of the stent was dilated with a 12 mm x 40 mm balloon. Final venography was performed. The vascular sheath was removed with manual compression. A dressing was placed over the old catheter site.  COMPLICATIONS: None  FINDINGS: The initial venography demonstrated that the left iliac veins were patent but there was a critical stenosis in the  proximal left common iliac artery. 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 There was a short segment of residual clot within the left femoral vein. The thrombosed segment was successfully treated with the Angiojet thrombectomy device. Flow was restored within this short segment. There was poor flow through the left common iliac vein. The flow did improve after balloon dilatation with the 12 mm balloon. A 14 mm stent was placed in the left common iliac vein and the proximal aspect of the stent extends into the distal IVC. This stent was widely patent at the end of the procedure and there was excellent flow through the iliac venous system at the end of the procedure.  IMPRESSION: Completion of catheter-directed thrombolysis to the left lower extremity. There is good flow through the left femoral vein and left iliac venous system. Small amount of nonocclusive thrombus remains.  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 Successful treatment of the left common iliac vein stenosis with balloon angioplasty and placement of a self-expanding metallic stent. Excellent flow through the left iliac venous system following these procedures.   Electronically Signed   By: Richarda Overlie M.D.   On: 07/01/2013 17:52   Ir Infusion Thrombol Venous Initial (ms)  06/30/2013   CLINICAL DATA:  Extensive left lower extremity DVT and symptomatic edema. The patient presents for venography with probable transcatheter thrombolytic therapy.  EXAM: LEFT EXTREMITY VENOGRAPHY; INFERIOR VENA CAVOGRAM; IR ULTRASOUND GUIDANCE VASC ACCESS LEFT; RADIOLOGY EXAMINATION  1. ULTRASOUND GUIDANCE FOR VASCULAR ACCESS OF THE LEFT POPLITEAL VEIN 2. LEFT LOWER EXTREMITY VENOGRAPHY 3. IVC VENOGRAPHY 4. TRANSCATHETER THROMBOLYTIC INFUSION THERAPY FOR VENOUS THROMBOSIS  MEDICATIONS: Sedation:  4.0 mg IV Versed and and 200 mcg IV fentanyl.  Total Moderate Sedation Time:  35 minutes.  CONTRAST:  30 mL VISIPAQUE  IODIXANOL 320 MG/ML IV SOLN  FLUOROSCOPY TIME:  5 min and 12 seconds.  PROCEDURE: Maximal sterile barrier technique was utilized including caps, mask, sterile gowns, sterile gloves, sterile drape, hand hygiene and skin antiseptic. The left popliteal fossa was prepped with Betadine. Local anesthesia was provided with 1% lidocaine.  Ultrasound was performed of the left popliteal vein and proximal calf veins. Under ultrasound guidance, a 21 gauge needle was advanced into the popliteal vein along the lower margin of the popliteal fossa. After securing access with a micropuncture set, a guidewire was advanced. A 6 French vascular sheath was then placed over the guidewire.  A 5 French catheter was then advanced and periodic contrast injection performed of the left lower extremity deep veins. The catheter was further advanced over a guidewire to the level of the common iliac vein and additional venography performed. Contrast venography was also performed at the level of the inferior vena cava.  A multi side-hole 5 French infusion catheter was then advanced over the guidewire. Infusion length is 58 cm. Positioning of the catheter was documented by fluoroscopic spot images. Thrombolytic infusion was begun via the infusion catheter with Tenecteplase infusing at a dose of 0.4 milligrams/hour. Normal saline running at 10 mL on our was connected to the solid arm of the popliteal sheath. The sheath was secured in the popliteal fossa with a retention suture and adhesive dressing.  COMPLICATIONS: None.  FINDINGS: Sonography confirms the presence of extensive deep venous thrombosis involving the entire popliteal vein, femoral vein of the thigh, common femoral vein and external iliac vein. The proximal common iliac vein in the pelvis demonstrates irregular stenosis and web formation with maximal degree of stenosis approaching 90%. 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 There likely is a component of chronic thrombus at  the level of irregular stenosis. The inferior vena cava is widely patent.  Thrombolytic therapy was begun extending from the upper popliteal vein above the knee joint to the level of the common iliac vein. Thrombolytic infusion will be continued overnight.  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 Thrombolytic infusion therapy was begun via a multi side hole infusion catheter extending from the popliteal vein to the common iliac vein. Infusion was begun with Tenecteplase infusing at a dose of 0.4 milligrams/hour. Concominant IV heparin will also be continued.   Electronically Signed   By: Irish Lack M.D.   On: 06/30/2013 12:18  Ir Rande Lawman F/u Eval Art/ven Final Day (ms)  07/01/2013   CLINICAL DATA:  62 year old with left lower extremity DVT and symptomatic edema. Catheter directed thrombolysis was started on 06/30/2013. Patient is scheduled for follow-up venogram.  EXAM: VENOGRAM TO FOLLOW-UP CATHETER DIRECTED THROMBOLYSIS; THROMBECTOMY WITH ANGIOJET DEVICE IN LEFT FEMORAL VEIN; PLACEMENT OF METALLIC STENT IN LEFT COMMON ILIAC VEIN.  Physician: Rachelle Hora. Henn, MD  MEDICATIONS: Versed 5 mg, fentanyl 200 mcg. A radiology nurse monitored the patient for moderate sedation.  ANESTHESIA/SEDATION: Moderate sedation time: 60 min  FLUOROSCOPY TIME:  13 min and 14 seconds  PROCEDURE: The procedure was explained to the patient. The risks and benefits of the procedure were discussed and the patient's questions were addressed. Informed consent was obtained from the patient. The patient was placed prone. The infusion catheter located in the back of the left knee was prepped and draped in a sterile fashion. Maximal barrier sterile technique was utilized including caps, mask, sterile gowns, sterile gloves, sterile drape, hand hygiene and  skin antiseptic. Contrast was initially injected through the infusion catheter. The catheter was removed over a Bentson wire and additional imaging was obtained with a 5 [place] catheter. The 6 French vascular sheath was exchanged for a 7 [place], 55 cm sheath. The residual thrombus in the mid left femoral vein was treated with the Angiojet thrombectomy device. Follow up venography was performed. Additional venography was performed in the pelvis. The left common iliac vein stenosis was treated with a 12 mm x 40 mm balloon. Follow-up venography was performed. The common iliac vein stenosis was then treated with a 14 mm x 80 mm Smart stent. The stent was successfully deployed with fluoroscopy. Follow-up venography was performed. Distal aspect of the stent was dilated with a 12 mm x 40 mm balloon. Final venography was performed. The vascular sheath was removed with manual compression. A dressing was placed over the old catheter site.  COMPLICATIONS: None  FINDINGS: The initial venography demonstrated that the left iliac veins were patent but there was a critical stenosis in the proximal left common iliac artery. 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 There was a short segment of residual clot within the left femoral vein. The thrombosed segment was successfully treated with the Angiojet thrombectomy device. Flow was restored within this short segment. There was poor flow through the left common iliac vein. The flow did improve after balloon dilatation with the 12 mm balloon. A 14 mm stent was placed in the left common iliac vein and the proximal aspect of the stent extends into the distal IVC. This stent was widely patent at the end of the procedure and there was excellent flow through the iliac venous system at the end of the procedure.  IMPRESSION: Completion of catheter-directed thrombolysis to the left lower extremity. There is good flow through the left femoral vein and left iliac venous system. Small  amount of nonocclusive thrombus remains.  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 Successful treatment of the left common iliac vein stenosis with balloon angioplasty and placement of a self-expanding metallic stent. Excellent flow through the left iliac venous system following these procedures.   Electronically Signed   By: Richarda Overlie M.D.   On: 07/01/2013 17:52    Anti-infectives: Anti-infectives   None      Assessment/Plan: s/p * No surgery found *  LLE venous lysis 12/3- 4 Doing well extr less swollen and less painful Labs stable Plan per Vascular  Report to Dr Miles Costain   LOS: 3 days    Omar Rogers A 07/02/2013

## 2013-07-02 NOTE — Progress Notes (Signed)
Patient ID: Omar Rogers, male   DOB: 03/03/1951, 62 y.o.   MRN: [id number] Dr. Lowella Dandy (IR) recommends use of at least TED hose and preferably 10-15 mm Hg  LE compression stockings for pt post venous lysis. We can f/u with pt in our IR clinic in 2 weeks (call [phone] to arrange).

## 2013-07-08 ENCOUNTER — Encounter: Payer: Medicare Other | Admitting: Vascular Surgery

## 2013-07-08 ENCOUNTER — Encounter (HOSPITAL_COMMUNITY): Payer: Medicare Other

## 2013-07-08 ENCOUNTER — Telehealth: Payer: Self-pay | Admitting: Hematology and Oncology

## 2013-07-08 NOTE — Telephone Encounter (Signed)
S/W PT AND GVE NP APPT 12/17 @ 10:15 W/DR. GORSUCH REFERRING DR. Thedore Mins DX- DVT WELCOME PACKET MAILED.

## 2013-07-08 NOTE — Telephone Encounter (Signed)
C/D 07/08/13 for appt. 07/14/13

## 2013-07-09 ENCOUNTER — Encounter: Payer: Self-pay | Admitting: Surgery

## 2013-07-12 ENCOUNTER — Encounter: Payer: Self-pay | Admitting: Surgery

## 2013-07-12 ENCOUNTER — Ambulatory Visit (INDEPENDENT_AMBULATORY_CARE_PROVIDER_SITE_OTHER): Payer: Medicare Other | Admitting: Surgery

## 2013-07-12 ENCOUNTER — Telehealth: Payer: Self-pay | Admitting: Hematology and Oncology

## 2013-07-12 VITALS — BP 121/73 | HR 90 | Ht 70.0 in | Wt 196.8 lb

## 2013-07-12 DIAGNOSIS — I82402 Acute embolism and thrombosis of unspecified deep veins of left lower extremity: Secondary | ICD-10-CM

## 2013-07-12 DIAGNOSIS — I82409 Acute embolism and thrombosis of unspecified deep veins of unspecified lower extremity: Secondary | ICD-10-CM

## 2013-07-12 NOTE — Telephone Encounter (Signed)
pt lvm that he wanted to cx appt...emailed Tiffany about new pt appt to contact pt.

## 2013-07-12 NOTE — Progress Notes (Signed)
Patient name: Omar Rogers MRN: [id number] DOB: November 05, 1950 Sex: male     Chief Complaint  Patient presents with  . Routine Post Op    1 wk f/u L iliofemoral DVT    HISTORY OF PRESENT ILLNESS: The patient is here today for followup.  I met him in the emergency department on December 2.  He was found to have a left leg deep vein thrombosis.  Because of the significant amount of swelling, I had recommended from a lysis.  He underwent successful thrombolysis.  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  His iliac vein was successfully stented after completion of from a lysis.  The patient has been placed on anticoagulations.  He continues to complain of swelling within his left leg that is improved with leg elevation.  He does not wear her compression.  Past Medical History  Diagnosis Date  . DVT (deep venous thrombosis) 06/29/2013    extensive left iliofemoral popliteal DVT/notes (06/29/2013)  . Bipolar disorder     Past Surgical History  Procedure Laterality Date  . Ankle fracture surgery Right ~ 2012    History   Social History  . Marital Status: Single    Spouse Name: N/A    Number of Children: N/A  . Years of Education: N/A   Occupational History  . Not on file.   Social History Main Topics  . Smoking status: Former Smoker -- 0.50 packs/day for 5 years    Types: Cigarettes  . Smokeless tobacco: Never Used     Comment: 06/29/2013 "quit smoking ~ 2 yr ago"  . Alcohol Use: 25.2 oz/week    42 Cans of beer per week  . Drug Use: No  . Sexual Activity: Not Currently   Other Topics Concern  . Not on file   Social History Narrative  . No narrative on file    No family history on file.  Allergies as of 07/12/2013  . (No Known Allergies)    Current Outpatient Prescriptions on File Prior to Visit  Medication Sig Dispense Refill  . naproxen sodium (ANAPROX) 220 MG tablet Take 220 mg by mouth once.       Marland Kitchen oxyCODONE (OXY IR/ROXICODONE) 5 MG immediate release tablet Take 1 tablet (5 mg total) by mouth every 4 (four) hours as needed for moderate pain.  30 tablet  0  . Rivaroxaban (XARELTO) 15 MG TABS tablet Take 1 tablet (15 mg total) by mouth 2 (two) times daily with a meal. Then get dose adjusted by PCP  41 tablet  0   No current facility-administered medications on file prior to visit.     REVIEW OF SYSTEMS: Cardiovascular: No chest pain, chest pressure, palpitations, orthopnea, or dyspnea on exertion. No claudication or rest pain,  positive left leg swelling. Pulmonary: No productive cough, asthma or wheezing. Neurologic: No weakness, paresthesias, aphasia, or amaurosis. No dizziness. Hematologic: No bleeding problems or clotting disorders. Musculoskeletal: No joint pain or joint swelling. Gastrointestinal: No blood in stool or hematemesis Genitourinary: No dysuria or hematuria. Psychiatric:: No history of major depression. Integumentary: No rashes or ulcers. Constitutional: No fever or chills.  PHYSICAL EXAMINATION:   Vital signs are BP 121/73  Pulse 90  Ht 5\' 10"  (1.778 m)  Wt 196 lb 12.8 oz (89.268 kg)  BMI 28.24 kg/m2  SpO2 96% General: The patient appears their stated age. HEENT:  No gross abnormalities Pulmonary:  Non labored breathing Musculoskeletal:  There are no major deformities. Neurologic: No focal weakness or paresthesias are detected, Skin: There are no ulcer or rashes noted. Psychiatric: The patient has normal affect. Cardiovascular: There is a regular rate and rhythm without significant murmur appreciated.  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 Plan: The patient has undergone successful from a lysis and subsequent left iliac vein stenting for iliofemoral DVT.  I told the patient that I would recommend 6 months of anti-coagulation.  In addition I stressed the importance of  compression therapy.  I discussed that this may need to be life long.  I gave him a prescription for 20-30 mm compression grade stockings.  He will follow up with me on an as-needed basis.  Omar Rogers, M.D. [facility] Office: [phone] Pager:  [phone]

## 2013-07-14 ENCOUNTER — Ambulatory Visit: Payer: Medicare Other

## 2013-07-14 ENCOUNTER — Ambulatory Visit: Payer: Medicare Other | Admitting: Hematology and Oncology

## 2013-07-19 ENCOUNTER — Encounter: Payer: Self-pay | Admitting: Internal Medicine

## 2013-07-19 ENCOUNTER — Ambulatory Visit: Payer: Medicare Other | Attending: Internal Medicine | Admitting: Internal Medicine

## 2013-07-19 VITALS — BP 134/96 | HR 85 | Temp 98.3°F | Resp 16 | Wt 199.2 lb

## 2013-07-19 DIAGNOSIS — M79609 Pain in unspecified limb: Secondary | ICD-10-CM

## 2013-07-19 DIAGNOSIS — I82402 Acute embolism and thrombosis of unspecified deep veins of left lower extremity: Secondary | ICD-10-CM

## 2013-07-19 DIAGNOSIS — M79605 Pain in left leg: Secondary | ICD-10-CM

## 2013-07-19 DIAGNOSIS — I82409 Acute embolism and thrombosis of unspecified deep veins of unspecified lower extremity: Secondary | ICD-10-CM

## 2013-07-19 DIAGNOSIS — Z139 Encounter for screening, unspecified: Secondary | ICD-10-CM

## 2013-07-19 MED ORDER — RIVAROXABAN 20 MG PO TABS: 20.0000 mg | ORAL_TABLET | Freq: Every day | ORAL | Status: DC

## 2013-07-19 MED ORDER — TRAMADOL HCL 50 MG PO TABS: 50.0000 mg | ORAL_TABLET | Freq: Three times a day (TID) | ORAL | Status: DC | PRN

## 2013-07-19 NOTE — Progress Notes (Signed)
Patient Demographics  Omar Rogers, is a 62 y.o. male  WUJ:[id number]  NFA:[id number]  DOB - 1951/05/07  CC:  Chief Complaint  Patient presents with  . Establish Care       HPI: Omar Rogers is a 62 y.o. 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 Patient has No headache, No chest pain, No abdominal pain - No Nausea, No new weakness tingling or numbness, No Cough - SOB.  No Known Allergies Past Medical History  Diagnosis Date  . DVT (deep venous thrombosis) 06/29/2013    extensive left iliofemoral popliteal DVT/notes (06/29/2013)  . Bipolar disorder   . 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 oxyCODONE (OXY IR/ROXICODONE) 5 MG immediate release tablet Take 1 tablet (5 mg total) by mouth every 4 (four) hours as needed for moderate pain.  30 tablet  0   No current facility-administered medications on file prior to visit.   Family History  Problem Relation Age of Onset  . Hypertension Mother   . Cancer Mother   . Hypertension Father    History   Social History  . Marital Status: Single    Spouse Name: N/A    Number of Children: N/A  . Years of Education: N/A   Occupational History  . Not on file.   Social History Main Topics  . Smoking status: Former Smoker -- 0.50 packs/day  for 5 years    Types: Cigarettes  . Smokeless tobacco: Never Used     Comment: 06/29/2013 "quit smoking ~ 2 yr ago"  . Alcohol Use: 25.2 oz/week    42 Cans of beer per week  . Drug Use: No  . Sexual Activity: Not Currently   Other Topics Concern  . Not on file   Social History Narrative  . No narrative on file    Review of Systems: Constitutional: Negative for fever, chills, diaphoresis, activity change, appetite change and fatigue. HENT: Negative for ear pain, nosebleeds, congestion, facial swelling, rhinorrhea, neck pain, neck stiffness and ear discharge.  Eyes: Negative for pain, discharge, redness, itching and visual disturbance. Respiratory: Negative for cough, choking, chest tightness, shortness of breath, wheezing and stridor.  Cardiovascular: Negative for chest pain, palpitations and leg swelling. Gastrointestinal: Negative for abdominal distention. Genitourinary: Negative for dysuria, urgency, frequency, hematuria, flank pain, decreased urine volume, difficulty urinating and dyspareunia.  Musculoskeletal: positive for left leg pain and swelling. Neurological: Negative for dizziness, tremors, seizures, syncope, facial asymmetry, speech difficulty, weakness, light-headedness, numbness and headaches.  Hematological: Negative for adenopathy. Does not bruise/bleed easily. Psychiatric/Behavioral: Negative for hallucinations, behavioral problems, confusion, dysphoric mood, decreased concentration and agitation.    Objective:   Filed Vitals:   07/19/13 0910  BP: 134/96  Pulse: 85  Temp: 98.3 F (36.8 C)  Resp: 16    Physical Exam: Constitutional: Patient appears well-developed and well-nourished. No distress.  HENT: Normocephalic, atraumatic, External right and left ear normal. Oropharynx is clear and moist.  Eyes: Conjunctivae and EOM are normal. PERRLA, no scleral icterus. Neck: Normal ROM. Neck supple. No JVD. No tracheal deviation. No thyromegaly. CVS: RRR, S1/S2 +,  no murmurs, no gallops, no carotid bruit.  Pulmonary: Effort and breath sounds normal, no stridor, rhonchi, wheezes, rales.  Abdominal: Soft. BS +, no distension, tenderness, rebound or guarding.  Musculoskeletal: Normal range of motion. Left leg 1+edema and minimal  tenderness. Not warm or any symptoms of cellulitis Neuro: Alert. Normal reflexes, muscle tone coordination. No cranial nerve deficit. Skin: Skin is warm and dry. No rash noted. Not diaphoretic. No erythema. No pallor. Psychiatric: Normal mood and affect. Behavior, judgment, thought content normal.  Lab Results  Component Value Date   WBC 8.8 07/02/2013   HGB 13.8 07/02/2013   HCT 39.9 07/02/2013   MCV 106.4* 07/02/2013   PLT 133* 07/02/2013   Lab Results  Component Value Date   CREATININE 0.90 07/02/2013   BUN 9 07/02/2013   NA 135 07/02/2013   K 3.7 07/02/2013   CL 99 07/02/2013   CO2 28 07/02/2013    No results found for this basename: HGBA1C   Lipid Panel     Component Value Date/Time   CHOL 245* 03/16/2008 2118   TRIG 265* 03/16/2008 2118   HDL 41 03/16/2008 2118   CHOLHDL 6.0 Ratio 03/16/2008 2118   VLDL 53* 03/16/2008 2118   LDLCALC 151* 03/16/2008 2118       Assessment and plan:   1. DVT (deep venous thrombosis), left Patient is taking xarelto 15 mg twice a day for 2 weeks Will switch to 20 mg daily - Rivaroxaban (XARELTO) 20 MG TABS tablet; Take 1 tablet (20 mg total) by mouth daily with supper.  Dispense: 30 tablet; Refill: 5 - CBC with Differential; Future - COMPLETE METABOLIC PANEL WITH GFR; Future Continue to follow with vascular.  2. Leg pain, left Advised patient to avoid taking NSAIDs as patient is on xarelto , will prescribe tramadol for severe pain and advised to take Tylenol. - traMADol (ULTRAM) 50 MG tablet; Take 1 tablet (50 mg total) by mouth every 8 (eight) hours as needed for moderate pain.  Dispense: 40 tablet; Refill: 0       Health Maintenance Pt declined flu shot   Follow up in 6  weeks.  Doris Cheadle, MD

## 2013-07-19 NOTE — Progress Notes (Signed)
Patient here for follow up  Hospital Diagnosed with DVT to left leg Currently on xarelto 15 mg

## 2013-09-06 ENCOUNTER — Telehealth: Payer: Self-pay | Admitting: Hematology and Oncology

## 2013-09-06 NOTE — Telephone Encounter (Signed)
LEFT MESSAGE FOR PATIENT TO RETURN CALL TO SCHEDULE NP APPT.  °

## 2013-09-13 ENCOUNTER — Ambulatory Visit: Payer: Medicare Other | Attending: Internal Medicine

## 2013-09-13 DIAGNOSIS — I82402 Acute embolism and thrombosis of unspecified deep veins of left lower extremity: Secondary | ICD-10-CM

## 2013-09-13 LAB — CBC WITH DIFFERENTIAL/PLATELET
BASOS ABS: 0.1 10*3/uL (ref 0.0–0.1)
Basophils Relative: 1 % (ref 0–1)
EOS PCT: 5 % (ref 0–5)
Eosinophils Absolute: 0.3 10*3/uL (ref 0.0–0.7)
HCT: 50.2 % (ref 39.0–52.0)
Hemoglobin: 17.6 g/dL — ABNORMAL HIGH (ref 13.0–17.0)
Lymphocytes Relative: 25 % (ref 12–46)
Lymphs Abs: 1.3 10*3/uL (ref 0.7–4.0)
MCH: 36.2 pg — ABNORMAL HIGH (ref 26.0–34.0)
MCHC: 35.1 g/dL (ref 30.0–36.0)
MCV: 103.3 fL — AB (ref 78.0–100.0)
Monocytes Absolute: 0.6 10*3/uL (ref 0.1–1.0)
Monocytes Relative: 12 % (ref 3–12)
NEUTROS ABS: 3 10*3/uL (ref 1.7–7.7)
Neutrophils Relative %: 57 % (ref 43–77)
Platelets: 249 10*3/uL (ref 150–400)
RBC: 4.86 MIL/uL (ref 4.22–5.81)
RDW: 15.9 % — AB (ref 11.5–15.5)
WBC: 5.2 10*3/uL (ref 4.0–10.5)

## 2013-09-14 LAB — COMPLETE METABOLIC PANEL WITH GFR
ALT: 82 U/L — AB (ref 0–53)
AST: 93 U/L — ABNORMAL HIGH (ref 0–37)
Albumin: 4.4 g/dL (ref 3.5–5.2)
Alkaline Phosphatase: 73 U/L (ref 39–117)
BUN: 8 mg/dL (ref 6–23)
CALCIUM: 9.8 mg/dL (ref 8.4–10.5)
CO2: 26 mEq/L (ref 19–32)
CREATININE: 0.98 mg/dL (ref 0.50–1.35)
Chloride: 101 mEq/L (ref 96–112)
GFR, Est African American: >89 mL/min
GFR, Est Non African American: 83 mL/min
Glucose, Bld: 101 mg/dL — ABNORMAL HIGH (ref 70–99)
Potassium: 4.5 mEq/L (ref 3.5–5.3)
Sodium: 137 mEq/L (ref 135–145)
TOTAL PROTEIN: 7.1 g/dL (ref 6.0–8.3)
Total Bilirubin: 1.1 mg/dL (ref 0.2–1.2)

## 2013-09-20 ENCOUNTER — Encounter: Payer: Self-pay | Admitting: Internal Medicine

## 2013-09-20 ENCOUNTER — Ambulatory Visit: Payer: Medicare Other | Attending: Internal Medicine | Admitting: Internal Medicine

## 2013-09-20 VITALS — BP 153/100 | HR 89 | Temp 98.6°F | Resp 16 | Wt 209.0 lb

## 2013-09-20 DIAGNOSIS — I10 Essential (primary) hypertension: Secondary | ICD-10-CM | POA: Insufficient documentation

## 2013-09-20 DIAGNOSIS — Z87891 Personal history of nicotine dependence: Secondary | ICD-10-CM | POA: Insufficient documentation

## 2013-09-20 DIAGNOSIS — IMO0002 Reserved for concepts with insufficient information to code with codable children: Secondary | ICD-10-CM | POA: Insufficient documentation

## 2013-09-20 DIAGNOSIS — Z79899 Other long term (current) drug therapy: Secondary | ICD-10-CM | POA: Insufficient documentation

## 2013-09-20 DIAGNOSIS — R740 Nonspecific elevation of levels of transaminase and lactic acid dehydrogenase [LDH]: Secondary | ICD-10-CM

## 2013-09-20 DIAGNOSIS — R7301 Impaired fasting glucose: Secondary | ICD-10-CM | POA: Insufficient documentation

## 2013-09-20 DIAGNOSIS — Z7901 Long term (current) use of anticoagulants: Secondary | ICD-10-CM | POA: Insufficient documentation

## 2013-09-20 DIAGNOSIS — Z09 Encounter for follow-up examination after completed treatment for conditions other than malignant neoplasm: Secondary | ICD-10-CM

## 2013-09-20 DIAGNOSIS — Z8249 Family history of ischemic heart disease and other diseases of the circulatory system: Secondary | ICD-10-CM | POA: Insufficient documentation

## 2013-09-20 DIAGNOSIS — I82509 Chronic embolism and thrombosis of unspecified deep veins of unspecified lower extremity: Secondary | ICD-10-CM | POA: Insufficient documentation

## 2013-09-20 DIAGNOSIS — R7401 Elevation of levels of liver transaminase levels: Secondary | ICD-10-CM | POA: Insufficient documentation

## 2013-09-20 DIAGNOSIS — F319 Bipolar disorder, unspecified: Secondary | ICD-10-CM | POA: Insufficient documentation

## 2013-09-20 DIAGNOSIS — I871 Compression of vein: Secondary | ICD-10-CM | POA: Insufficient documentation

## 2013-09-20 DIAGNOSIS — I82409 Acute embolism and thrombosis of unspecified deep veins of unspecified lower extremity: Secondary | ICD-10-CM

## 2013-09-20 MED ORDER — AMLODIPINE BESYLATE 2.5 MG PO TABS: 2.5000 mg | ORAL_TABLET | Freq: Every day | ORAL | Status: DC

## 2013-09-20 NOTE — Progress Notes (Signed)
Patient here for follow up Blood work results] Patient states his blood pressure has been running high

## 2013-09-20 NOTE — Progress Notes (Signed)
MRN: [id number] Name: Omar Rogers  Sex: male Age: 63 y.o. DOB: 04/02/1951  Allergies: Review of patient's allergies indicates no known allergies.  Chief Complaint  Patient presents with  . blood work results    HPI: Patient is 63 y.o. male who comes today for followup history of may Thurner syndrome, left lower extremity DVT, currently on xarelto, patient has missed appointment with oncology, is advised to schedule appointment denies any bleeding, recently had a blood work done which was reviewed with the patient, noticed impaired fasting glucose as well as elevated transaminase levels, as per patient he drinks heavily on a regular basis, I have advised patient to cut down on drinking, also his blood pressure is elevated, as per patient he saw a psychiatrist a few days ago his blood pressure at that point was also elevated, patient has family history of hypertension, denies any headache dizziness chest and shortness of breath.  Past Medical History  Diagnosis Date  . DVT (deep venous thrombosis) 06/29/2013    extensive left iliofemoral popliteal DVT/notes (06/29/2013)  . Bipolar disorder   . 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 Ankle fracture surgery Right ~ 2012      Medication List       This list is accurate as of: 09/20/13 10:15 AM.  Always use your most recent med list.               amLODipine 2.5 MG tablet  Commonly known as:  NORVASC  Take 1 tablet (2.5 mg total) by mouth daily.     oxyCODONE 5 MG immediate release tablet  Commonly known as:  Oxy IR/ROXICODONE  Take 1 tablet (5 mg total) by mouth every 4 (four) hours as needed for moderate pain.     Rivaroxaban 20 MG Tabs tablet  Commonly known as:  XARELTO  Take 1 tablet (20 mg total) by mouth daily with supper.     traMADol 50 MG tablet  Commonly known as:  ULTRAM  Take 1 tablet (50 mg total) by mouth every 8 (eight) hours as needed for moderate  pain.        Meds ordered this encounter  Medications  . amLODipine (NORVASC) 2.5 MG tablet    Sig: Take 1 tablet (2.5 mg total) by mouth daily.    Dispense:  90 tablet    Refill:  3     There is no immunization history on file for this patient.  Family History  Problem Relation Age of Onset  . Hypertension Mother   . Cancer Mother   . Hypertension Father     History  Substance Use Topics  . Smoking status: Former Smoker -- 0.50 packs/day for 5 years    Types: Cigarettes  . Smokeless tobacco: Never Used     Comment: 06/29/2013 "quit smoking ~ 2 yr ago"  . Alcohol Use: 25.2 oz/week    42 Cans of beer per week    Review of Systems   As noted in HPI  Filed Vitals:   09/20/13 0933  BP: 153/100  Pulse: 89  Temp: 98.6 F (37 C)  Resp: 16    Physical Exam  Physical Exam  Constitutional: No distress.  Eyes: Conjunctivae and EOM are normal. Pupils are equal, round, and reactive to light.  Cardiovascular: Normal rate and regular rhythm.   Pulmonary/Chest: Breath sounds normal. No respiratory distress. He has no wheezes. He has no rales.  Musculoskeletal: He  exhibits no edema.    CBC    Component Value Date/Time   WBC 5.2 09/13/2013 0912   RBC 4.86 09/13/2013 0912   HGB 17.6* 09/13/2013 0912   HCT 50.2 09/13/2013 0912   PLT 249 09/13/2013 0912   MCV 103.3* 09/13/2013 0912   LYMPHSABS 1.3 09/13/2013 0912   MONOABS 0.6 09/13/2013 0912   EOSABS 0.3 09/13/2013 0912   BASOSABS 0.1 09/13/2013 0912    CMP     Component Value Date/Time   NA 137 09/13/2013 0912   K 4.5 09/13/2013 0912   CL 101 09/13/2013 0912   CO2 26 09/13/2013 0912   GLUCOSE 101* 09/13/2013 0912   BUN 8 09/13/2013 0912   CREATININE 0.98 09/13/2013 0912   CREATININE 0.90 07/02/2013 0400   CALCIUM 9.8 09/13/2013 0912   PROT 7.1 09/13/2013 0912   ALBUMIN 4.4 09/13/2013 0912   AST 93* 09/13/2013 0912   ALT 82* 09/13/2013 0912   ALKPHOS 73 09/13/2013 0912   BILITOT 1.1 09/13/2013 0912   GFRNONAA >90 07/02/2013  0400   GFRAA >90 07/02/2013 0400    Lab Results  Component Value Date/Time   CHOL 245* 03/16/2008  9:18 PM    No components found with this basename: hga1c    Lab Results  Component Value Date/Time   AST 93* 09/13/2013  9:12 AM    Assessment and Plan  Follow up  Elevated transaminase level Most likely secondary to excessive alcohol intake, patient is going to cut down on drinking.   DVT (deep venous thrombosis) Currently on xarelto patient is going to follow with hematology/oncology  Essential hypertension, benign Have advised patient for DASH diet, also started on low-dose Norvasc 2.5 mg daily, patient will come back in 2 weeks for BP check  IFG (impaired fasting glucose) Advised patient for low carbohydrate diet. Repeat blood chemistry on the next visit   Return in about 3 months (around 12/18/2013) for BP check in 2 weeks .  Doris CheadleADVANI, Yatzari Jonsson, MD

## 2013-09-20 NOTE — Patient Instructions (Signed)
DASH Diet  The DASH diet stands for "Dietary Approaches to Stop Hypertension." It is a healthy eating plan that has been shown to reduce high blood pressure (hypertension) in as little as 14 days, while also possibly providing other significant health benefits. These other health benefits include reducing the risk of breast cancer after menopause and reducing the risk of type 2 diabetes, heart disease, colon cancer, and stroke. Health benefits also include weight loss and slowing kidney failure in patients with chronic kidney disease.   DIET GUIDELINES  · Limit salt (sodium). Your diet should contain less than 1500 mg of sodium daily.  · Limit refined or processed carbohydrates. Your diet should include mostly whole grains. Desserts and added sugars should be used sparingly.  · Include small amounts of heart-healthy fats. These types of fats include nuts, oils, and tub margarine. Limit saturated and trans fats. These fats have been shown to be harmful in the body.  CHOOSING FOODS   The following food groups are based on a 2000 calorie diet. See your Registered Dietitian for individual calorie needs.  Grains and Grain Products (6 to 8 servings daily)  · Eat More Often: Whole-wheat bread, brown rice, whole-grain or wheat pasta, quinoa, popcorn without added fat or salt (air popped).  · Eat Less Often: White bread, white pasta, white rice, cornbread.  Vegetables (4 to 5 servings daily)  · Eat More Often: Fresh, frozen, and canned vegetables. Vegetables may be raw, steamed, roasted, or grilled with a minimal amount of fat.  · Eat Less Often/Avoid: Creamed or fried vegetables. Vegetables in a cheese sauce.  Fruit (4 to 5 servings daily)  · Eat More Often: All fresh, canned (in natural juice), or frozen fruits. Dried fruits without added sugar. One hundred percent fruit juice (½ cup [237 mL] daily).  · Eat Less Often: Dried fruits with added sugar. Canned fruit in light or heavy syrup.  Lean Meats, Fish, and Poultry (2  servings or less daily. One serving is 3 to 4 oz [85-114 g]).  · Eat More Often: Ninety percent or leaner ground beef, tenderloin, sirloin. Round cuts of beef, chicken breast, turkey breast. All fish. Grill, bake, or broil your meat. Nothing should be fried.  · Eat Less Often/Avoid: Fatty cuts of meat, turkey, or chicken leg, thigh, or wing. Fried cuts of meat or fish.  Dairy (2 to 3 servings)  · Eat More Often: Low-fat or fat-free milk, low-fat plain or light yogurt, reduced-fat or part-skim cheese.  · Eat Less Often/Avoid: Milk (whole, 2%). Whole milk yogurt. Full-fat cheeses.  Nuts, Seeds, and Legumes (4 to 5 servings per week)  · Eat More Often: All without added salt.  · Eat Less Often/Avoid: Salted nuts and seeds, canned beans with added salt.  Fats and Sweets (limited)  · Eat More Often: Vegetable oils, tub margarines without trans fats, sugar-free gelatin. Mayonnaise and salad dressings.  · Eat Less Often/Avoid: Coconut oils, palm oils, butter, stick margarine, cream, half and half, cookies, candy, pie.  FOR MORE INFORMATION  The Dash Diet Eating Plan: www.dashdiet.org  Document Released: 07/04/2011 Document Revised: 10/07/2011 Document Reviewed: 07/04/2011  [employer]® Patient Information ©2014 [employer].

## 2013-11-08 ENCOUNTER — Ambulatory Visit: Payer: Medicare Other | Admitting: Internal Medicine

## 2014-01-06 ENCOUNTER — Encounter: Payer: Self-pay | Admitting: Internal Medicine

## 2014-01-06 ENCOUNTER — Ambulatory Visit: Payer: Medicare Other | Attending: Internal Medicine | Admitting: Internal Medicine

## 2014-01-06 VITALS — BP 104/61 | HR 64 | Temp 98.8°F | Resp 16 | Wt 183.2 lb

## 2014-01-06 DIAGNOSIS — R7301 Impaired fasting glucose: Secondary | ICD-10-CM | POA: Diagnosis present

## 2014-01-06 DIAGNOSIS — I10 Essential (primary) hypertension: Secondary | ICD-10-CM | POA: Diagnosis not present

## 2014-01-06 DIAGNOSIS — Z87891 Personal history of nicotine dependence: Secondary | ICD-10-CM | POA: Insufficient documentation

## 2014-01-06 DIAGNOSIS — I82409 Acute embolism and thrombosis of unspecified deep veins of unspecified lower extremity: Secondary | ICD-10-CM

## 2014-01-06 DIAGNOSIS — E785 Hyperlipidemia, unspecified: Secondary | ICD-10-CM | POA: Diagnosis not present

## 2014-01-06 MED ORDER — RIVAROXABAN 20 MG PO TABS: 20.0000 mg | ORAL_TABLET | Freq: Every day | ORAL | Status: DC

## 2014-01-06 NOTE — Patient Instructions (Addendum)
DASH Diet  The DASH diet stands for "Dietary Approaches to Stop Hypertension." It is a healthy eating plan that has been shown to reduce high blood pressure (hypertension) in as little as 14 days, while also possibly providing other significant health benefits. These other health benefits include reducing the risk of breast cancer after menopause and reducing the risk of type 2 diabetes, heart disease, colon cancer, and stroke. Health benefits also include weight loss and slowing kidney failure in patients with chronic kidney disease.   DIET GUIDELINES  · Limit salt (sodium). Your diet should contain less than 1500 mg of sodium daily.  · Limit refined or processed carbohydrates. Your diet should include mostly whole grains. Desserts and added sugars should be used sparingly.  · Include small amounts of heart-healthy fats. These types of fats include nuts, oils, and tub margarine. Limit saturated and trans fats. These fats have been shown to be harmful in the body.  CHOOSING FOODS   The following food groups are based on a 2000 calorie diet. See your Registered Dietitian for individual calorie needs.  Grains and Grain Products (6 to 8 servings daily)  · Eat More Often: Whole-wheat bread, brown rice, whole-grain or wheat pasta, quinoa, popcorn without added fat or salt (air popped).  · Eat Less Often: White bread, white pasta, white rice, cornbread.  Vegetables (4 to 5 servings daily)  · Eat More Often: Fresh, frozen, and canned vegetables. Vegetables may be raw, steamed, roasted, or grilled with a minimal amount of fat.  · Eat Less Often/Avoid: Creamed or fried vegetables. Vegetables in a cheese sauce.  Fruit (4 to 5 servings daily)  · Eat More Often: All fresh, canned (in natural juice), or frozen fruits. Dried fruits without added sugar. One hundred percent fruit juice (½ cup [237 mL] daily).  · Eat Less Often: Dried fruits with added sugar. Canned fruit in light or heavy syrup.  Lean Meats, Fish, and Poultry (2  servings or less daily. One serving is 3 to 4 oz [85-114 g]).  · Eat More Often: Ninety percent or leaner ground beef, tenderloin, sirloin. Round cuts of beef, chicken breast, turkey breast. All fish. Grill, bake, or broil your meat. Nothing should be fried.  · Eat Less Often/Avoid: Fatty cuts of meat, turkey, or chicken leg, thigh, or wing. Fried cuts of meat or fish.  Dairy (2 to 3 servings)  · Eat More Often: Low-fat or fat-free milk, low-fat plain or light yogurt, reduced-fat or part-skim cheese.  · Eat Less Often/Avoid: Milk (whole, 2%). Whole milk yogurt. Full-fat cheeses.  Nuts, Seeds, and Legumes (4 to 5 servings per week)  · Eat More Often: All without added salt.  · Eat Less Often/Avoid: Salted nuts and seeds, canned beans with added salt.  Fats and Sweets (limited)  · Eat More Often: Vegetable oils, tub margarines without trans fats, sugar-free gelatin. Mayonnaise and salad dressings.  · Eat Less Often/Avoid: Coconut oils, palm oils, butter, stick margarine, cream, half and half, cookies, candy, pie.  FOR MORE INFORMATION  The Dash Diet Eating Plan: www.dashdiet.org  Document Released: 07/04/2011 Document Revised: 10/07/2011 Document Reviewed: 07/04/2011  ExitCare® Patient Information ©2014 [employer].  Fat and Cholesterol Control Diet  Fat and cholesterol levels in your blood and organs are influenced by your diet. High levels of fat and cholesterol may lead to diseases of the heart, small and large blood vessels, gallbladder, liver, and pancreas.  CONTROLLING FAT AND CHOLESTEROL WITH DIET  Although exercise and lifestyle factors   are important, your diet is key. That is because certain foods are known to raise cholesterol and others to lower it. The goal is to balance foods for their effect on cholesterol and more importantly, to replace saturated and trans fat with other types of fat, such as monounsaturated fat, polyunsaturated fat, and omega-3 fatty acids.  On average, a person should consume no  more than 15 to 17 g of saturated fat daily. Saturated and trans fats are considered "bad" fats, and they will raise LDL cholesterol. Saturated fats are primarily found in animal products such as meats, butter, and cream. However, that does not mean you need to give up all your favorite foods. Today, there are good tasting, low-fat, low-cholesterol substitutes for most of the things you like to eat. Choose low-fat or nonfat alternatives. Choose round or loin cuts of red meat. These types of cuts are lowest in fat and cholesterol. Chicken (without the skin), fish, veal, and ground turkey breast are great choices. Eliminate fatty meats, such as hot dogs and salami. Even shellfish have little or no saturated fat. Have a 3 oz (85 g) portion when you eat lean meat, poultry, or fish.  Trans fats are also called "partially hydrogenated oils." They are oils that have been scientifically manipulated so that they are solid at room temperature resulting in a longer shelf life and improved taste and texture of foods in which they are added. Trans fats are found in stick margarine, some tub margarines, cookies, crackers, and baked goods.   When baking and cooking, oils are a great substitute for butter. The monounsaturated oils are especially beneficial since it is believed they lower LDL and raise HDL. The oils you should avoid entirely are saturated tropical oils, such as coconut and palm.   Remember to eat a lot from food groups that are naturally free of saturated and trans fat, including fish, fruit, vegetables, beans, grains (barley, rice, couscous, bulgur wheat), and pasta (without cream sauces).   IDENTIFYING FOODS THAT LOWER FAT AND CHOLESTEROL   Soluble fiber may lower your cholesterol. This type of fiber is found in fruits such as apples, vegetables such as broccoli, potatoes, and carrots, legumes such as beans, peas, and lentils, and grains such as barley. Foods fortified with plant sterols (phytosterol) may also  lower cholesterol. You should eat at least 2 g per day of these foods for a cholesterol lowering effect.   Read package labels to identify low-saturated fats, trans fat free, and low-fat foods at the supermarket. Select cheeses that have only 2 to 3 g saturated fat per ounce. Use a heart-healthy tub margarine that is free of trans fats or partially hydrogenated oil. When buying baked goods (cookies, crackers), avoid partially hydrogenated oils. Breads and muffins should be made from whole grains (whole-wheat or whole oat flour, instead of "flour" or "enriched flour"). Buy non-creamy canned soups with reduced salt and no added fats.   FOOD PREPARATION TECHNIQUES   Never deep-fry. If you must fry, either stir-fry, which uses very little fat, or use non-stick cooking sprays. When possible, broil, bake, or roast meats, and steam vegetables. Instead of putting butter or margarine on vegetables, use lemon and herbs, applesauce, and cinnamon (for squash and sweet potatoes). Use nonfat yogurt, salsa, and low-fat dressings for salads.   LOW-SATURATED FAT / LOW-FAT FOOD SUBSTITUTES  Meats / Saturated Fat (g)  · Avoid: Steak, marbled (3 oz/85 g) / 11 g  · Choose: Steak, lean (3 oz/85 g) / 4   g  · Avoid: Hamburger (3 oz/85 g) / 7 g  · Choose: Hamburger, lean (3 oz/85 g) / 5 g  · Avoid: Ham (3 oz/85 g) / 6 g  · Choose: Ham, lean cut (3 oz/85 g) / 2.4 g  · Avoid: Chicken, with skin, dark meat (3 oz/85 g) / 4 g  · Choose: Chicken, skin removed, dark meat (3 oz/85 g) / 2 g  · Avoid: Chicken, with skin, light meat (3 oz/85 g) / 2.5 g  · Choose: Chicken, skin removed, light meat (3 oz/85 g) / 1 g  Dairy / Saturated Fat (g)  · Avoid: Whole milk (1 cup) / 5 g  · Choose: Low-fat milk, 2% (1 cup) / 3 g  · Choose: Low-fat milk, 1% (1 cup) / 1.5 g  · Choose: Skim milk (1 cup) / 0.3 g  · Avoid: Hard cheese (1 oz/28 g) / 6 g  · Choose: Skim milk cheese (1 oz/28 g) / 2 to 3 g  · Avoid: Cottage cheese, 4% fat (1 cup) / 6.5 g  · Choose: Low-fat  cottage cheese, 1% fat (1 cup) / 1.5 g  · Avoid: Ice cream (1 cup) / 9 g  · Choose: Sherbet (1 cup) / 2.5 g  · Choose: Nonfat frozen yogurt (1 cup) / 0.3 g  · Choose: Frozen fruit bar / trace  · Avoid: Whipped cream (1 tbs) / 3.5 g  · Choose: Nondairy whipped topping (1 tbs) / 1 g  Condiments / Saturated Fat (g)  · Avoid: Mayonnaise (1 tbs) / 2 g  · Choose: Low-fat mayonnaise (1 tbs) / 1 g  · Avoid: Butter (1 tbs) / 7 g  · Choose: Extra light margarine (1 tbs) / 1 g  · Avoid: Coconut oil (1 tbs) / 11.8 g  · Choose: Olive oil (1 tbs) / 1.8 g  · Choose: Corn oil (1 tbs) / 1.7 g  · Choose: Safflower oil (1 tbs) / 1.2 g  · Choose: Sunflower oil (1 tbs) / 1.4 g  · Choose: Soybean oil (1 tbs) / 2.4 g  · Choose: Canola oil (1 tbs) / 1 g  Document Released: 07/15/2005 Document Revised: 11/09/2012 Document Reviewed: 01/03/2011  ExitCare® Patient Information ©2014 [employer].

## 2014-01-06 NOTE — Progress Notes (Signed)
MRN: [id number] Name: Omar Rogers  Sex: male Age: 63 y.o. DOB: 11-Dec-1950  Allergies: Review of patient's allergies indicates no known allergies.  Chief Complaint  Patient presents with  . Follow-up    HPI: Patient is 63 y.o. male who history of hypertension DVT hyperlipidemia comes today for followup, as per patient he stopped taking Norvasc and has modified his diet blood pressure is improved, patient also is taking Xarelto for left leg DVT, patient has missed appointment with his oncologist, is requesting refill on his medication. Patient denies any acute symptoms. Denies any chest pain or shortness of breath.  Past Medical History  Diagnosis Date  . DVT (deep venous thrombosis) 06/29/2013    extensive left iliofemoral popliteal DVT/notes (06/29/2013)  . Bipolar disorder   . 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 Ankle fracture surgery Right ~ 2012      Medication List       This list is accurate as of: 01/06/14 11:15 AM.  Always use your most recent med list.               amLODipine 2.5 MG tablet  Commonly known as:  NORVASC  Take 1 tablet (2.5 mg total) by mouth daily.     oxyCODONE 5 MG immediate release tablet  Commonly known as:  Oxy IR/ROXICODONE  Take 1 tablet (5 mg total) by mouth every 4 (four) hours as needed for moderate pain.     rivaroxaban 20 MG Tabs tablet  Commonly known as:  XARELTO  Take 1 tablet (20 mg total) by mouth daily with supper.     traMADol 50 MG tablet  Commonly known as:  ULTRAM  Take 1 tablet (50 mg total) by mouth every 8 (eight) hours as needed for moderate pain.        Meds ordered this encounter  Medications  . rivaroxaban (XARELTO) 20 MG TABS tablet    Sig: Take 1 tablet (20 mg total) by mouth daily with supper.    Dispense:  30 tablet    Refill:  2     There is no immunization history on file for this patient.  Family History  Problem Relation Age of  Onset  . Hypertension Mother   . Cancer Mother   . Hypertension Father     History  Substance Use Topics  . Smoking status: Former Smoker -- 0.50 packs/day for 5 years    Types: Cigarettes  . Smokeless tobacco: Never Used     Comment: 06/29/2013 "quit smoking ~ 2 yr ago"  . Alcohol Use: 25.2 oz/week    42 Cans of beer per week    Review of Systems   As noted in HPI  Filed Vitals:   01/06/14 1043  BP: 104/61  Pulse: 64  Temp: 98.8 F (37.1 C)  Resp: 16    Physical Exam  Physical Exam  Constitutional: No distress.  Eyes: EOM are normal. Pupils are equal, round, and reactive to light.  Cardiovascular: Normal rate and regular rhythm.   Pulmonary/Chest: Breath sounds normal. No respiratory distress. He has no wheezes. He has no rales.  Musculoskeletal:  Left leg no tenderness or erythema    CBC    Component Value Date/Time   WBC 5.2 09/13/2013 0912   RBC 4.86 09/13/2013 0912   HGB 17.6* 09/13/2013 0912   HCT 50.2 09/13/2013 0912   PLT 249 09/13/2013 0912   MCV 103.3* 09/13/2013 0912  LYMPHSABS 1.3 09/13/2013 0912   MONOABS 0.6 09/13/2013 0912   EOSABS 0.3 09/13/2013 0912   BASOSABS 0.1 09/13/2013 0912    CMP     Component Value Date/Time   NA 137 09/13/2013 0912   K 4.5 09/13/2013 0912   CL 101 09/13/2013 0912   CO2 26 09/13/2013 0912   GLUCOSE 101* 09/13/2013 0912   BUN 8 09/13/2013 0912   CREATININE 0.98 09/13/2013 0912   CREATININE 0.90 07/02/2013 0400   CALCIUM 9.8 09/13/2013 0912   PROT 7.1 09/13/2013 0912   ALBUMIN 4.4 09/13/2013 0912   AST 93* 09/13/2013 0912   ALT 82* 09/13/2013 0912   ALKPHOS 73 09/13/2013 0912   BILITOT 1.1 09/13/2013 0912   GFRNONAA 83 09/13/2013 0912   GFRNONAA >90 07/02/2013 0400   GFRAA >89 09/13/2013 0912   GFRAA >90 07/02/2013 0400    Lab Results  Component Value Date/Time   CHOL 245* 03/16/2008  9:18 PM    No components found with this basename: hga1c    Lab Results  Component Value Date/Time   AST 93* 09/13/2013  9:12 AM     Assessment and Plan  IFG (impaired fasting glucose) Have advised patient for low carbohydrate diet  DVT (deep venous thrombosis) - Plan: rivaroxaban (XARELTO) 20 MG TABS tablet, patient will followup  with hematology/oncology to decide if he is to continue with this medication.  Essential hypertension, benign Continue with diet modification blood pressure is improved  Dyslipidemia Patient is advised for low-fat diet, will repeat fasting lipid panel on the next visit   Return in about 3 months (around 04/08/2014) for hyperipidemia, IFG.  Doris Cheadle, MD

## 2014-01-06 NOTE — Progress Notes (Signed)
Patient here for follow up of clot to his left leg

## 2014-05-13 ENCOUNTER — Other Ambulatory Visit: Payer: Self-pay

## 2014-05-13 DIAGNOSIS — F200 Paranoid schizophrenia: Secondary | ICD-10-CM | POA: Insufficient documentation

## 2014-09-06 DIAGNOSIS — E872 Acidosis, unspecified: Secondary | ICD-10-CM | POA: Insufficient documentation

## 2014-09-06 DIAGNOSIS — E876 Hypokalemia: Secondary | ICD-10-CM | POA: Insufficient documentation

## 2014-09-06 DIAGNOSIS — E8729 Other acidosis: Secondary | ICD-10-CM | POA: Insufficient documentation

## 2014-09-06 DIAGNOSIS — F1190 Opioid use, unspecified, uncomplicated: Secondary | ICD-10-CM | POA: Insufficient documentation

## 2015-11-08 DIAGNOSIS — Z7189 Other specified counseling: Secondary | ICD-10-CM | POA: Insufficient documentation

## 2016-08-02 DIAGNOSIS — Z79899 Other long term (current) drug therapy: Secondary | ICD-10-CM | POA: Diagnosis not present

## 2016-08-02 DIAGNOSIS — R69 Illness, unspecified: Secondary | ICD-10-CM | POA: Diagnosis not present

## 2016-08-02 DIAGNOSIS — Z0000 Encounter for general adult medical examination without abnormal findings: Secondary | ICD-10-CM | POA: Diagnosis not present

## 2016-08-02 DIAGNOSIS — Z6828 Body mass index (BMI) 28.0-28.9, adult: Secondary | ICD-10-CM | POA: Diagnosis not present

## 2016-08-16 DIAGNOSIS — R69 Illness, unspecified: Secondary | ICD-10-CM | POA: Diagnosis not present

## 2016-11-14 DIAGNOSIS — R69 Illness, unspecified: Secondary | ICD-10-CM | POA: Diagnosis not present

## 2017-02-03 DIAGNOSIS — R69 Illness, unspecified: Secondary | ICD-10-CM | POA: Diagnosis not present

## 2017-06-02 DIAGNOSIS — R69 Illness, unspecified: Secondary | ICD-10-CM | POA: Diagnosis not present

## 2017-06-23 ENCOUNTER — Emergency Department (HOSPITAL_COMMUNITY)
Admission: EM | Admit: 2017-06-23 | Discharge: 2017-06-23 | Disposition: A | Discharge status: Home / Self Care | Payer: Medicare HMO | Attending: Emergency Medicine | Admitting: Emergency Medicine

## 2017-06-23 ENCOUNTER — Emergency Department (HOSPITAL_COMMUNITY): Payer: Medicare HMO

## 2017-06-23 ENCOUNTER — Other Ambulatory Visit: Payer: Self-pay

## 2017-06-23 ENCOUNTER — Encounter (HOSPITAL_COMMUNITY): Payer: Self-pay

## 2017-06-23 DIAGNOSIS — Z7901 Long term (current) use of anticoagulants: Secondary | ICD-10-CM | POA: Diagnosis not present

## 2017-06-23 DIAGNOSIS — R300 Dysuria: Secondary | ICD-10-CM | POA: Insufficient documentation

## 2017-06-23 DIAGNOSIS — Z79899 Other long term (current) drug therapy: Secondary | ICD-10-CM | POA: Insufficient documentation

## 2017-06-23 DIAGNOSIS — I10 Essential (primary) hypertension: Secondary | ICD-10-CM | POA: Diagnosis not present

## 2017-06-23 DIAGNOSIS — R1031 Right lower quadrant pain: Secondary | ICD-10-CM

## 2017-06-23 DIAGNOSIS — R1030 Lower abdominal pain, unspecified: Secondary | ICD-10-CM | POA: Diagnosis not present

## 2017-06-23 DIAGNOSIS — R109 Unspecified abdominal pain: Secondary | ICD-10-CM | POA: Diagnosis present

## 2017-06-23 DIAGNOSIS — N201 Calculus of ureter: Secondary | ICD-10-CM | POA: Insufficient documentation

## 2017-06-23 DIAGNOSIS — N132 Hydronephrosis with renal and ureteral calculous obstruction: Secondary | ICD-10-CM | POA: Diagnosis not present

## 2017-06-23 DIAGNOSIS — Z87891 Personal history of nicotine dependence: Secondary | ICD-10-CM | POA: Diagnosis not present

## 2017-06-23 LAB — URINALYSIS, ROUTINE W REFLEX MICROSCOPIC
BILIRUBIN URINE: NEGATIVE
Bilirubin Urine: NEGATIVE
GLUCOSE, UA: NEGATIVE mg/dL
Glucose, UA: NEGATIVE mg/dL
HGB URINE DIPSTICK: NEGATIVE
Hgb urine dipstick: NEGATIVE
Ketones, ur: 5 mg/dL — AB
Ketones, ur: 5 mg/dL — AB
Nitrite: NEGATIVE
Nitrite: NEGATIVE
PH: 5 (ref 5.0–8.0)
PROTEIN: 30 mg/dL — AB
Protein, ur: 30 mg/dL — AB
RBC / HPF: NONE SEEN RBC/hpf (ref 0–5)
SPECIFIC GRAVITY, URINE: 1.027 (ref 1.005–1.030)
SPECIFIC GRAVITY, URINE: 1.027 (ref 1.005–1.030)
Squamous Epithelial / LPF: NONE SEEN
pH: 5 (ref 5.0–8.0)

## 2017-06-23 LAB — COMPREHENSIVE METABOLIC PANEL
ALT: 66 U/L — ABNORMAL HIGH (ref 17–63)
ANION GAP: 7 (ref 5–15)
AST: 56 U/L — ABNORMAL HIGH (ref 15–41)
Albumin: 4 g/dL (ref 3.5–5.0)
Alkaline Phosphatase: 51 U/L (ref 38–126)
BUN: 8 mg/dL (ref 6–20)
CHLORIDE: 101 mmol/L (ref 101–111)
CO2: 24 mmol/L (ref 22–32)
CREATININE: 1.37 mg/dL — AB (ref 0.61–1.24)
Calcium: 8.7 mg/dL — ABNORMAL LOW (ref 8.9–10.3)
GFR, EST NON AFRICAN AMERICAN: 53 mL/min — AB (ref >60)
Glucose, Bld: 103 mg/dL — ABNORMAL HIGH (ref 65–99)
POTASSIUM: 3.9 mmol/L (ref 3.5–5.1)
SODIUM: 132 mmol/L — AB (ref 135–145)
Total Bilirubin: 0.5 mg/dL (ref 0.3–1.2)
Total Protein: 6.5 g/dL (ref 6.5–8.1)

## 2017-06-23 LAB — CBC
HEMATOCRIT: 42.8 % (ref 39.0–52.0)
HEMOGLOBIN: 14.5 g/dL (ref 13.0–17.0)
MCH: 35.7 pg — ABNORMAL HIGH (ref 26.0–34.0)
MCHC: 33.9 g/dL (ref 30.0–36.0)
MCV: 105.4 fL — AB (ref 78.0–100.0)
PLATELETS: 218 10*3/uL (ref 150–400)
RBC: 4.06 MIL/uL — AB (ref 4.22–5.81)
RDW: 13.6 % (ref 11.5–15.5)
WBC: 11.5 10*3/uL — AB (ref 4.0–10.5)

## 2017-06-23 LAB — LIPASE, BLOOD: LIPASE: 23 U/L (ref 11–51)

## 2017-06-23 MED ORDER — SULFAMETHOXAZOLE-TRIMETHOPRIM 800-160 MG PO TABS
1.0000 | ORAL_TABLET | Freq: Two times a day (BID) | ORAL | 0 refills | Status: DC
Start: 2017-06-23 — End: 2017-10-29

## 2017-06-23 MED ORDER — MECLIZINE HCL 25 MG PO TABS: 25.0000 mg | ORAL_TABLET | Freq: Three times a day (TID) | ORAL | 0 refills | Status: DC | PRN

## 2017-06-23 MED ORDER — ONDANSETRON HCL 4 MG PO TABS: 4.0000 mg | ORAL_TABLET | Freq: Three times a day (TID) | ORAL | 0 refills | Status: DC | PRN

## 2017-06-23 MED ORDER — HYDROCODONE-ACETAMINOPHEN 5-325 MG PO TABS: 1.0000 | ORAL_TABLET | ORAL | 0 refills | Status: DC | PRN

## 2017-06-23 NOTE — ED Provider Notes (Signed)
[facility] EMERGENCY DEPARTMENT Provider Note   CSN: [id number] Arrival date & time: 06/23/17  1347     History   Chief Complaint Chief Complaint  Patient presents with  . Abdominal Pain    HPI Omar Rogers is a 66 y.o. male.  HPI  Omar Rogers is a 66yo male with a history of bipolar disorder, hypertension, dyslipidemia who presents emergency department for evaluation of right-sided suprapubic pain which began this morning.  Patient states that pain is 6/10 in severity, constant and "dull" in nature.  Pain does not radiate. States that the pain was excruciating earlier, but has gone down some since coming to the ER. Pain is worsened with urination which is "burning." He has not taken any OTC medication for his pain. He denies fever, N/V, diarrhea, penile pain, penile discharge, testicular pain/swelling, hematuria, urinary frequency, headache, chest pain, shortness of breath. States that he had prostatitis in his 60's. Denies previous abdominal surgeries or history of stones. Denies recent antibiotic use.   Past Medical History:  Diagnosis Date  . Bipolar disorder (HCC)   . DVT (deep venous thrombosis) (HCC) 06/29/2013   extensive left iliofemoral popliteal DVT/notes (06/29/2013)  . 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 Dyslipidemia 01/06/2014  . Elevated transaminase level 09/20/2013  . IFG (impaired fasting glucose) 09/20/2013  . Essential hypertension, benign 09/20/2013  . 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 DVT (deep vein thrombosis) in pregnancy (HCC) 06/29/2013  . DVT (deep venous thrombosis) (HCC) 06/29/2013  . Bipolar disorder, unspecified (HCC) 06/29/2013  . Leukocytosis, unspecified 06/29/2013  . Hyponatremia 06/29/2013    Past Surgical History:  Procedure Laterality Date  . ANKLE FRACTURE SURGERY Right ~ 2012       Home Medications    Prior to Admission medications   Medication Sig  Start Date End Date Taking? Authorizing Provider  amLODipine (NORVASC) 2.5 MG tablet Take 1 tablet (2.5 mg total) by mouth daily. 09/20/13   Doris Cheadle, MD  oxyCODONE (OXY IR/ROXICODONE) 5 MG immediate release tablet Take 1 tablet (5 mg total) by mouth every 4 (four) hours as needed for moderate pain. 07/02/13   Leroy Sea, MD  rivaroxaban (XARELTO) 20 MG TABS tablet Take 1 tablet (20 mg total) by mouth daily with supper. 01/06/14   Doris Cheadle, MD  traMADol (ULTRAM) 50 MG tablet Take 1 tablet (50 mg total) by mouth every 8 (eight) hours as needed for moderate pain. 07/19/13   Doris Cheadle, MD    Family History Family History  Problem Relation Age of Onset  . Hypertension Mother   . Cancer Mother   . Hypertension Father     Social History Social History   Tobacco Use  . Smoking status: Former Smoker    Packs/day: 0.50    Years: 5.00    Pack years: 2.50    Types: Cigarettes  . Smokeless tobacco: Never Used  . Tobacco comment: 06/29/2013 "quit smoking ~ 2 yr ago"  Substance Use Topics  . Alcohol use: Yes    Alcohol/week: 25.2 oz    Types: 42 Cans of beer per week  . Drug use: No     Allergies   Patient has no known allergies.   Review of Systems Review of Systems  Constitutional: Negative for chills, fatigue and fever.  Eyes: Negative for visual disturbance.  Respiratory: Negative for shortness of breath.   Cardiovascular: Negative for chest pain.  Gastrointestinal: Positive  for abdominal pain (right sided suprapubic pain). Negative for blood in stool, diarrhea, nausea and vomiting.  Genitourinary: Positive for dysuria. Negative for difficulty urinating, discharge, flank pain, frequency, hematuria, penile pain, scrotal swelling and testicular pain.  Musculoskeletal: Negative for back pain and gait problem.  Skin: Negative for rash.  Neurological: Negative for weakness, numbness and headaches.  Psychiatric/Behavioral: Negative for agitation.     Physical  Exam Updated Vital Signs BP (!) 140/96   Pulse 67   Temp 97.9 F (36.6 C) (Oral)   Resp 16   Ht 5\' 10"  (1.778 m)   Wt 81.6 kg (180 lb)   SpO2 99%   BMI 25.83 kg/m   Physical Exam  Constitutional: He is oriented to person, place, and time. He appears well-developed and well-nourished.  Non-toxic appearance. He does not appear ill.  Sitting comfortably at the bedside  HENT:  Head: Normocephalic and atraumatic.  Mouth/Throat: Oropharynx is clear and moist. No oropharyngeal exudate.  Mucous membranes moist  Eyes: Pupils are equal, round, and reactive to light. Right eye exhibits no discharge. Left eye exhibits no discharge. No scleral icterus.  Cardiovascular: Normal rate, regular rhythm and intact distal pulses. Exam reveals no friction rub.  No murmur heard. Pulmonary/Chest: Effort normal and breath sounds normal. No stridor. No respiratory distress. He has no wheezes. He has no rales.  Abdominal: Soft. Bowel sounds are normal. He exhibits no distension, no abdominal bruit and no mass.  Mild tenderness to palpation of the suprapubic area. No guarding or rigidity. No rebound. No CVA tenderness.   Musculoskeletal: Normal range of motion.  Neurological: He is alert and oriented to person, place, and time.  Skin: Skin is warm and dry. Capillary refill takes less than 2 seconds.  Psychiatric: He has a normal mood and affect. His behavior is normal.  Nursing note and vitals reviewed.    ED Treatments / Results  Labs (all labs ordered are listed, but only abnormal results are displayed) Labs Reviewed  COMPREHENSIVE METABOLIC PANEL - Abnormal; Notable for the following components:      Result Value   Sodium 132 (*)    Glucose, Bld 103 (*)    Creatinine, Ser 1.37 (*)    Calcium 8.7 (*)    AST 56 (*)    ALT 66 (*)    GFR calc non Af Amer 53 (*)    All other components within normal limits  CBC - Abnormal; Notable for the following components:   WBC 11.5 (*)    RBC 4.06 (*)     MCV 105.4 (*)    MCH 35.7 (*)    All other components within normal limits  URINALYSIS, ROUTINE W REFLEX MICROSCOPIC - Abnormal; Notable for the following components:   Color, Urine AMBER (*)    APPearance HAZY (*)    Ketones, ur 5 (*)    Protein, ur 30 (*)    Leukocytes, UA SMALL (*)    All other components within normal limits  URINALYSIS, ROUTINE W REFLEX MICROSCOPIC - Abnormal; Notable for the following components:   APPearance TURBID (*)    Ketones, ur 5 (*)    Protein, ur 30 (*)    Leukocytes, UA SMALL (*)    Bacteria, UA MANY (*)    All other components within normal limits  URINE CULTURE  LIPASE, BLOOD    EKG  EKG Interpretation None       Radiology No results found.  Procedures Procedures (including critical care time)  Medications  Ordered in ED Medications - No data to display   Initial Impression / Assessment and Plan / ED Course  I have reviewed the triage vital signs and the nursing notes.  Pertinent labs & imaging results that were available during my care of the patient were reviewed by me and considered in my medical decision making (see chart for details).     Patient with suprapubic abdominal pain which began today.  UA infected with leuks and many bacteria. Given he is a male, will send for culture. CBC reveals mild leukocytosis of 11.5. CMP reveals creatinine 1.37 (0.98 three years ago), otherwise unremarkable. Lipase WNL.   Patient afebrile, nontoxic appearing. No CVA tenderness to suggest pyelonephrosis. Given he has a bump in creatinine and pain was worse earlier today will get CT stone study to evaluate further. Discussed this patient with Dr. Adriana Simasook who agrees with this plan  CT stone study positive for 3mm stone at the distal right ureter with mild hydronephrosis. Will send home with pain management, anti-emetics and bactrim for UTI.  Patient counseled on typical course of kidney stones.  Have given him information to follow-up with urology if  he continues to have pain in a week.  His blood pressure was mildly elevated in the emergency department today, counseled him to have this rechecked.  Discussed return precautions.  Patient agrees and voiced understanding to the above plan.  Dr. Adriana Simasook also saw this patient and agrees with above plan.  Final Clinical Impressions(s) / ED Diagnoses   Final diagnoses:  Groin pain, right     Kellie ShropshireShrosbree, Alyissa Whidbee J, PA-C 06/24/17 1617    Donnetta Hutchingook, Brian, MD 06/24/17 2030

## 2017-06-23 NOTE — Discharge Instructions (Signed)
Your urine is infected.  Please take antibiotic twice a day for the next 7 days.  You also have a kidney stone.  This can take a week to pass.  I have prescribed you a pain medicine called Norco, this is an opioid medicine which can make you drowsy.  Please do not drive, work or drink alcohol while taking this medicine.  Only take it as needed.  I have also written you a prescription for vomiting called Zofran.  Please only take this if you feel nauseated.  Your blood pressure was elevated in the ER today, please have this rechecked.  Return to the emergency department if you can no longer urinate, have vomiting that will not stop or if any new or worsening symptoms.

## 2017-06-23 NOTE — ED Triage Notes (Signed)
Per Pt, Pt reports having lower right abdominal pain that started this morning when he woke up. Denies any N/V/D, blood in urine. Reports some pain with urination.

## 2017-06-25 LAB — URINE CULTURE: Culture: NO GROWTH

## 2017-09-02 DIAGNOSIS — R69 Illness, unspecified: Secondary | ICD-10-CM | POA: Diagnosis not present

## 2017-10-23 DIAGNOSIS — Z87891 Personal history of nicotine dependence: Secondary | ICD-10-CM | POA: Diagnosis not present

## 2017-10-23 DIAGNOSIS — R69 Illness, unspecified: Secondary | ICD-10-CM | POA: Diagnosis not present

## 2017-10-23 DIAGNOSIS — R030 Elevated blood-pressure reading, without diagnosis of hypertension: Secondary | ICD-10-CM | POA: Diagnosis not present

## 2017-10-29 ENCOUNTER — Encounter: Payer: Self-pay | Admitting: Family Medicine

## 2017-10-29 ENCOUNTER — Ambulatory Visit (INDEPENDENT_AMBULATORY_CARE_PROVIDER_SITE_OTHER): Payer: Medicare HMO | Admitting: Family Medicine

## 2017-10-29 ENCOUNTER — Ambulatory Visit: Payer: Medicare Other | Admitting: Family Medicine

## 2017-10-29 VITALS — BP 142/90 | HR 80 | Temp 98.0°F | Ht 70.0 in | Wt 185.0 lb

## 2017-10-29 DIAGNOSIS — I10 Essential (primary) hypertension: Secondary | ICD-10-CM | POA: Diagnosis not present

## 2017-10-29 DIAGNOSIS — R7309 Other abnormal glucose: Secondary | ICD-10-CM | POA: Diagnosis not present

## 2017-10-29 DIAGNOSIS — R918 Other nonspecific abnormal finding of lung field: Secondary | ICD-10-CM

## 2017-10-29 DIAGNOSIS — N400 Enlarged prostate without lower urinary tract symptoms: Secondary | ICD-10-CM | POA: Diagnosis not present

## 2017-10-29 DIAGNOSIS — R748 Abnormal levels of other serum enzymes: Secondary | ICD-10-CM | POA: Diagnosis not present

## 2017-10-29 DIAGNOSIS — R911 Solitary pulmonary nodule: Secondary | ICD-10-CM | POA: Diagnosis not present

## 2017-10-29 DIAGNOSIS — R82998 Other abnormal findings in urine: Secondary | ICD-10-CM

## 2017-10-29 LAB — POCT URINALYSIS DIP (DEVICE)
Bilirubin Urine: NEGATIVE
GLUCOSE, UA: NEGATIVE mg/dL
Ketones, ur: NEGATIVE mg/dL
NITRITE: NEGATIVE
Protein, ur: NEGATIVE mg/dL
Specific Gravity, Urine: 1.01 (ref 1.005–1.030)
UROBILINOGEN UA: 0.2 mg/dL (ref 0.0–1.0)
pH: 5 (ref 5.0–8.0)

## 2017-10-29 LAB — POCT GLYCOSYLATED HEMOGLOBIN (HGB A1C): Hemoglobin A1C: 5.2

## 2017-10-29 MED ORDER — AMLODIPINE BESYLATE 10 MG PO TABS: 10.0000 mg | ORAL_TABLET | Freq: Every day | ORAL | 3 refills | Status: DC

## 2017-10-29 MED ORDER — CEPHALEXIN 500 MG PO CAPS: 500.0000 mg | ORAL_CAPSULE | Freq: Two times a day (BID) | ORAL | 0 refills | Status: AC

## 2017-10-29 NOTE — Progress Notes (Signed)
Patient ID: Omar Rogers, male    DOB: [birth year], 67 y.o.   MRN: [id number]  PCP: Bing NeighborsHarris, Zarai Orsborn S, FNP  Chief Complaint  Patient presents with  . Hypertension    Subjective:  HPI Omar DecemberBenjamin P Blanchard is a 67 y.o. male presents to establish care. 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   Omar Rogers has been lost to primary care follow-up for sometime. He suffers from hypertension and has been out of amlodipine for a while. He reports occasionally  monitoring of blood pressure and obtaining readings in excess of 150/90. Occasionally walks for physical activity. Denies shortness of breath, chest pain, dizziness, weakness, or headaches. 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 He is not chronically anticoagulated. Omar Rogers presented to the ED back in November with a complaint of groin pain and was found to have a renal stone measuring 3mm which was an obstructive stone (see CT of abdomen 06/23/2017) and complicated UTI. He feels that he passed the stone as he has not experienced any dysuria, groin or flank pain, and or hematuria. CT study also revealed an enlarged prostate however patient denies any urinary retention or weak stream, or urine frequency. Has a known history of prostate cancer in family. CT study reveal an incidental finding 4 mm subpleural left lower lung nodule. Patient is a former smoker. Omar Rogers suffers from bipolar disorder and is currently followed by the [facility] for mental health needs. He denies any recent behavioral health admissions, suicidal ideations, homicidal ideations, auditory hallucinations, or agitation. Social History   Socioeconomic History  . Marital status: Single    Spouse name: Not on file  . Number of children: Not on file  . Years of education: Not on file  . Highest education level: Not on file  Occupational History  . Not on file  Social  Needs  . Financial resource strain: Not on file  . Food insecurity:    Worry: Not on file    Inability: Not on file  . Transportation needs:    Medical: Not on file    Non-medical: Not on file  Tobacco Use  . Smoking status: Former Smoker    Packs/day: 0.50    Years: 5.00    Pack years: 2.50    Types: Cigarettes  . Smokeless tobacco: Never Used  . Tobacco comment: 06/29/2013 "quit smoking ~ 2 yr ago"  Substance and Sexual Activity  . Alcohol use: Yes    Alcohol/week: 25.2 oz    Types: 42 Cans of beer per week  . Drug use: No  . Sexual activity: Not Currently  Lifestyle  . Physical activity:    Days per week: Not on file    Minutes per session: Not on file  . Stress: Not on file  Relationships  . Social connections:    Talks on phone: Not on file    Gets together: Not on file    Attends religious service: Not on file    Active member of club or organization: Not on file    Attends meetings of clubs or organizations: Not on file    Relationship status: Not on file  . Intimate partner violence:    Fear of current or ex partner: Not on file    Emotionally abused: Not on file    Physically abused: Not on file    Forced sexual activity: Not on file  Other Topics Concern  . Not on  file  Social History Narrative  . Not on file    Family History  Problem Relation Age of Onset  . Hypertension Mother   . Cancer Mother   . Hypertension Father    Review of Systems Pertinent negatives listed in HPI  Patient Active Problem List   Diagnosis Date Noted  . Dyslipidemia 01/06/2014  . Elevated transaminase level 09/20/2013  . IFG (impaired fasting glucose) 09/20/2013  . Essential hypertension, benign 09/20/2013  . 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 DVT (deep vein thrombosis) in pregnancy (HCC) 06/29/2013  . DVT (deep venous thrombosis) (HCC) 06/29/2013  . Bipolar disorder, unspecified (HCC) 06/29/2013  . Leukocytosis, unspecified 06/29/2013  . Hyponatremia 06/29/2013     Allergies  Allergen Reactions  . Chlorpromazine Other (See Comments)    unknown reaction- reported from previous hospital records  . Haloperidol Other (See Comments)    Unknown reaction-reported via hospital records    Prior to Admission medications   Medication Sig Start Date End Date Taking? Authorizing Provider  asenapine (SAPHRIS) 5 MG SUBL 24 hr tablet Place 10 mg under the tongue at bedtime.   Yes [provider]  lamoTRIgine (LAMICTAL) 100 MG tablet Take 100 mg by mouth daily.   Yes [provider]  HYDROcodone-acetaminophen (NORCO/VICODIN) 5-325 MG tablet Take 1 tablet by mouth every 4 (four) hours as needed. Patient not taking: Reported on 10/29/2017 06/23/17   Kellie Shropshire, PA-C    Past Medical, Surgical Family and Social History reviewed and updated.    Objective:   Today's Vitals   10/29/17 1341  BP: (!) 142/90  Pulse: 80  Temp: 98 F (36.7 C)  TempSrc: Oral  SpO2: 96%  Weight: 185 lb (83.9 kg)  Height: 5\' 10"  (1.778 m)    Wt Readings from Last 3 Encounters:  10/29/17 185 lb (83.9 kg)  06/23/17 180 lb (81.6 kg)  01/06/14 183 lb 3.2 oz (83.1 kg)    Physical Exam Constitutional: Patient appears well-developed and well-nourished. No distress. HENT: Normocephalic, atraumatic, External right and left ear normal.  Eyes: Conjunctivae and EOM are normal. PERRLA, no scleral icterus. Neck: Normal ROM. Neck supple. No JVD. No tracheal deviation. No thyromegaly. CVS: RRR, S1/S2 +, no murmurs, no gallops, no carotid bruit.  Pulmonary: Effort and breath sounds normal, no stridor, rhonchi, wheezes, rales.  Abdominal: Soft. BS +, no distension, tenderness, rebound or guarding.  Musculoskeletal: Normal range of motion. No edema and no tenderness.  Neuro: Alert. Normal reflexes, muscle tone coordination. No cranial nerve deficit. Skin: Skin is warm and dry. No rash noted. Not diaphoretic. No erythema. No pallor. Psychiatric: Flat affect.  Behavior, judgment, thought content normal.   Assessment & Plan:  1. Elevated glucose, checking A1C today, 5.2   2. Essential hypertension, benign,  Resume Amlodipine. EKG We have discussed target BP range and blood pressure goal. I have advised patient to check BP regularly and to call us back or report to clinic if the numbers are consistently higher than 140/90. We discussed the importance of compliance with medical therapy and DASH diet recommended, consequences of uncontrolled hypertension discussed. Continue current BP medications.   3. Incidental lung nodule, less than or equal to 3 mm, will repeat a CT study in 6 months to evaluate for any additional nodules and or an increase in size of nodule identified in the left lobe of the lung.   4. Elevated liver enzymes, checking CMP. Patient is heavy  - Comprehensive metabolic panel  5. Enlarged prostate, checking  PSA level. Recent CT indicated mild enlargement of prostate gland.  Patient is asymptomatic.  6. Leukocytes in urine, obtaining a urine culture patient previously noticed with a 3 mm obstructive stone in the right distal ureter however patient has been asymptomatic since that time.  He was advised to follow-up with urology and reports today that no one ever contacted him therefore he has not seen a urologist.  He is asymptomatic today.  UA revealed a trace of RBCs.     RTC: 6 months for chronic condition follow-up.  1 month for blood pressure check.   Godfrey Pick. Tiburcio Pea, MSN, FNP-C [facility]  [address] [place], [place] [address] [phone]

## 2017-10-29 NOTE — Patient Instructions (Signed)
Start amlodipine 10 mg once daily for blood pressure.  For urine infection start Kelfex 500 mg twice daily x 7 days.    Hypertension Hypertension is another name for high blood pressure. High blood pressure forces your heart to work harder to pump blood. This can cause problems over time. There are two numbers in a blood pressure reading. There is a top number (systolic) over a bottom number (diastolic). It is best to have a blood pressure below 120/80. Healthy choices can help lower your blood pressure. You may need medicine to help lower your blood pressure if:  Your blood pressure cannot be lowered with healthy choices.  Your blood pressure is higher than 130/80.  Follow these instructions at home: Eating and drinking  If directed, follow the DASH eating plan. This diet includes: ? Filling half of your plate at each meal with fruits and vegetables. ? Filling one quarter of your plate at each meal with whole grains. Whole grains include whole wheat pasta, brown rice, and whole grain bread. ? Eating or drinking low-fat dairy products, such as skim milk or low-fat yogurt. ? Filling one quarter of your plate at each meal with low-fat (lean) proteins. Low-fat proteins include fish, skinless chicken, eggs, beans, and tofu. ? Avoiding fatty meat, cured and processed meat, or chicken with skin. ? Avoiding premade or processed food.  Eat less than 1,500 mg of salt (sodium) a day.  Limit alcohol use to no more than 1 drink a day for nonpregnant women and 2 drinks a day for men. One drink equals 12 oz of beer, 5 oz of wine, or 1 oz of hard liquor. Lifestyle  Work with your doctor to stay at a healthy weight or to lose weight. Ask your doctor what the best weight is for you.  Get at least 30 minutes of exercise that causes your heart to beat faster (aerobic exercise) most days of the week. This may include walking, swimming, or biking.  Get at least 30 minutes of exercise that strengthens  your muscles (resistance exercise) at least 3 days a week. This may include lifting weights or pilates.  Do not use any products that contain nicotine or tobacco. This includes cigarettes and e-cigarettes. If you need help quitting, ask your doctor.  Check your blood pressure at home as told by your doctor.  Keep all follow-up visits as told by your doctor. This is important. Medicines  Take over-the-counter and prescription medicines only as told by your doctor. Follow directions carefully.  Do not skip doses of blood pressure medicine. The medicine does not work as well if you skip doses. Skipping doses also puts you at risk for problems.  Ask your doctor about side effects or reactions to medicines that you should watch for. Contact a doctor if:  You think you are having a reaction to the medicine you are taking.  You have headaches that keep coming back (recurring).  You feel dizzy.  You have swelling in your ankles.  You have trouble with your vision. Get help right away if:  You get a very bad headache.  You start to feel confused.  You feel weak or numb.  You feel faint.  You get very bad pain in your: ? Chest. ? Belly (abdomen).  You throw up (vomit) more than once.  You have trouble breathing. Summary  Hypertension is another name for high blood pressure.  Making healthy choices can help lower blood pressure. If your blood pressure cannot be  controlled with healthy choices, you may need to take medicine. This information is not intended to replace advice given to you by your health care provider. Make sure you discuss any questions you have with your health care provider. Document Released: 01/01/2008 Document Revised: 06/12/2016 Document Reviewed: 06/12/2016 Elsevier Interactive Patient Education  [employer].

## 2017-10-30 LAB — COMPREHENSIVE METABOLIC PANEL
A/G RATIO: 1.8 (ref 1.2–2.2)
ALBUMIN: 4.6 g/dL (ref 3.6–4.8)
ALK PHOS: 77 IU/L (ref 39–117)
ALT: 26 IU/L (ref 0–44)
AST: 29 IU/L (ref 0–40)
BUN/Creatinine Ratio: 9 — ABNORMAL LOW (ref 10–24)
BUN: 8 mg/dL (ref 8–27)
Bilirubin Total: 0.4 mg/dL (ref 0.0–1.2)
CALCIUM: 9.8 mg/dL (ref 8.6–10.2)
CO2: 22 mmol/L (ref 20–29)
Chloride: 101 mmol/L (ref 96–106)
Creatinine, Ser: 0.9 mg/dL (ref 0.76–1.27)
GFR calc Af Amer: 104 mL/min/{1.73_m2} (ref >59)
GFR calc non Af Amer: 90 mL/min/{1.73_m2} (ref >59)
GLOBULIN, TOTAL: 2.5 g/dL (ref 1.5–4.5)
GLUCOSE: 87 mg/dL (ref 65–99)
POTASSIUM: 4.1 mmol/L (ref 3.5–5.2)
SODIUM: 142 mmol/L (ref 134–144)
Total Protein: 7.1 g/dL (ref 6.0–8.5)

## 2017-10-30 LAB — PSA: PROSTATE SPECIFIC AG, SERUM: 3.4 ng/mL (ref 0.0–4.0)

## 2017-10-31 LAB — URINE CULTURE: ORGANISM ID, BACTERIA: NO GROWTH

## 2017-11-12 ENCOUNTER — Telehealth: Payer: Self-pay | Admitting: Family Medicine

## 2017-11-12 NOTE — Telephone Encounter (Signed)
Please schedule patient for CT chest for anytime in July to follow-up on a new lung nodules identified from a previously obtained CT renal study which showed a lung nodule of the left lower lobe.   Omar PickKimberly S. Tiburcio PeaHarris, MSN, FNP-C [facility]  [address] [place], [place] [address] [phone]

## 2017-11-12 NOTE — Telephone Encounter (Signed)
Patient scheduled.

## 2017-11-18 ENCOUNTER — Ambulatory Visit (HOSPITAL_COMMUNITY): Payer: Medicare HMO

## 2017-11-26 DIAGNOSIS — R69 Illness, unspecified: Secondary | ICD-10-CM | POA: Diagnosis not present

## 2017-11-27 DIAGNOSIS — R69 Illness, unspecified: Secondary | ICD-10-CM | POA: Diagnosis not present

## 2017-11-28 ENCOUNTER — Ambulatory Visit: Payer: Medicare HMO

## 2017-11-28 VITALS — BP 122/72

## 2017-11-28 DIAGNOSIS — I10 Essential (primary) hypertension: Secondary | ICD-10-CM

## 2017-11-28 NOTE — Progress Notes (Signed)
Patient was here for a bp check. It was 122/72 Manually. Patient was advised to continue current medications and to keep next scheduled follow up. Thanks!

## 2017-12-17 DIAGNOSIS — R69 Illness, unspecified: Secondary | ICD-10-CM | POA: Diagnosis not present

## 2017-12-19 DIAGNOSIS — F341 Dysthymic disorder: Secondary | ICD-10-CM | POA: Diagnosis not present

## 2017-12-19 DIAGNOSIS — R69 Illness, unspecified: Secondary | ICD-10-CM | POA: Diagnosis not present

## 2017-12-20 DIAGNOSIS — I10 Essential (primary) hypertension: Secondary | ICD-10-CM | POA: Diagnosis not present

## 2017-12-20 DIAGNOSIS — R69 Illness, unspecified: Secondary | ICD-10-CM | POA: Diagnosis not present

## 2017-12-20 DIAGNOSIS — G4700 Insomnia, unspecified: Secondary | ICD-10-CM | POA: Diagnosis not present

## 2017-12-20 DIAGNOSIS — Z87442 Personal history of urinary calculi: Secondary | ICD-10-CM | POA: Diagnosis not present

## 2017-12-20 DIAGNOSIS — F609 Personality disorder, unspecified: Secondary | ICD-10-CM | POA: Diagnosis not present

## 2017-12-20 DIAGNOSIS — F341 Dysthymic disorder: Secondary | ICD-10-CM | POA: Diagnosis not present

## 2017-12-20 DIAGNOSIS — F339 Major depressive disorder, recurrent, unspecified: Secondary | ICD-10-CM | POA: Diagnosis not present

## 2018-02-28 IMAGING — CT CT RENAL STONE PROTOCOL
2 of 4 series · 15 of 46 positions shown, 17 images · non-contrast
Comparison: None available.

CLINICAL DATA: Initial evaluation for acute right flank pain for 1
day.

EXAM:
CT ABDOMEN AND PELVIS WITHOUT CONTRAST
TECHNIQUE: Multidetector CT imaging of the abdomen and pelvis was performed
following the standard protocol without IV contrast.

[Series 3: ap without · axial · non-contrast · 0.73mm/px · z∈[+798,+1238]mm · 12 of 98 slices shown, 14 images]
[im 5/98  soft-tissue]
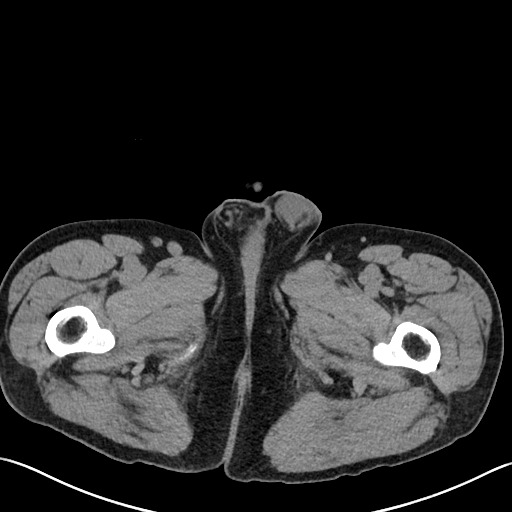
[im 5/98  bone]
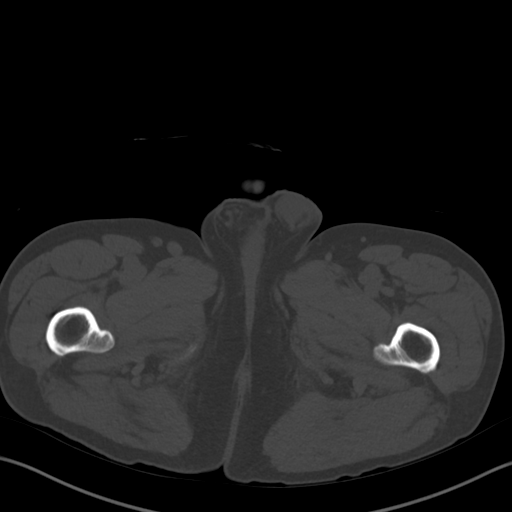
[im 15/98  soft-tissue]
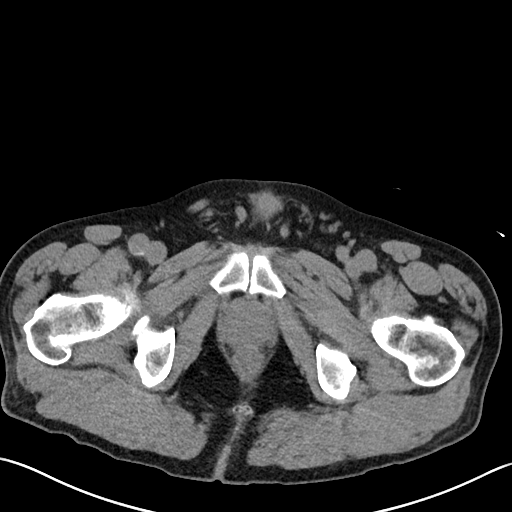
[im 20/98  soft-tissue]
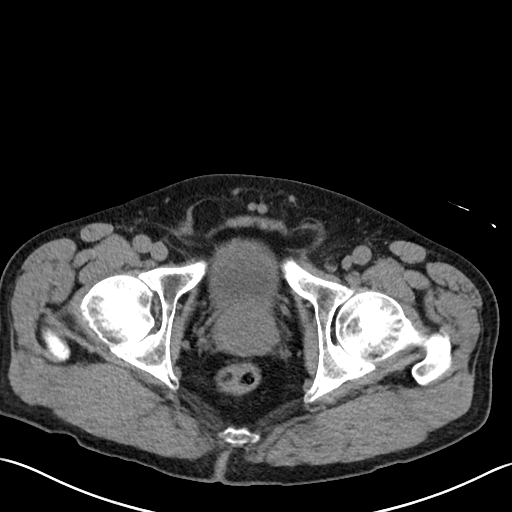
[im 30/98  soft-tissue]
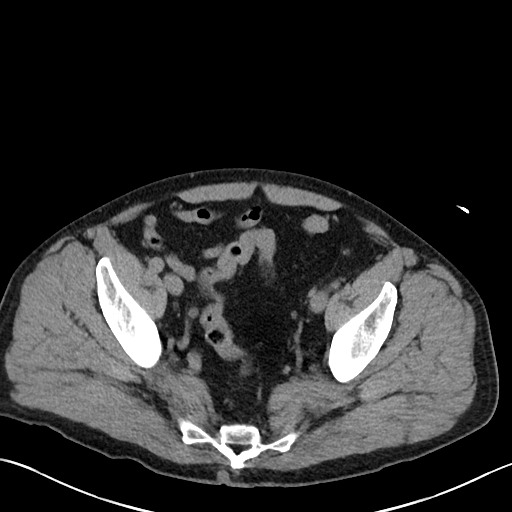
[im 39/98  soft-tissue]
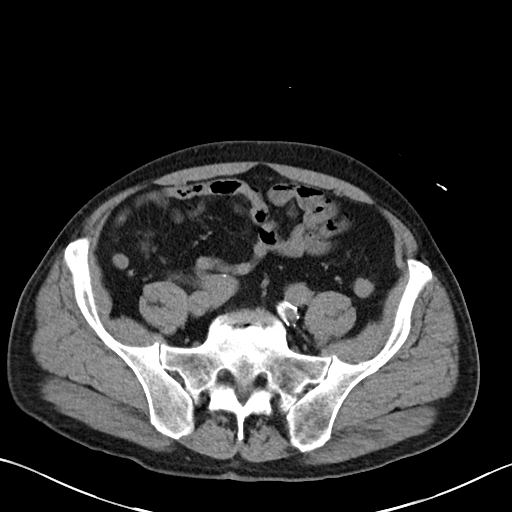
[im 44/98  soft-tissue]
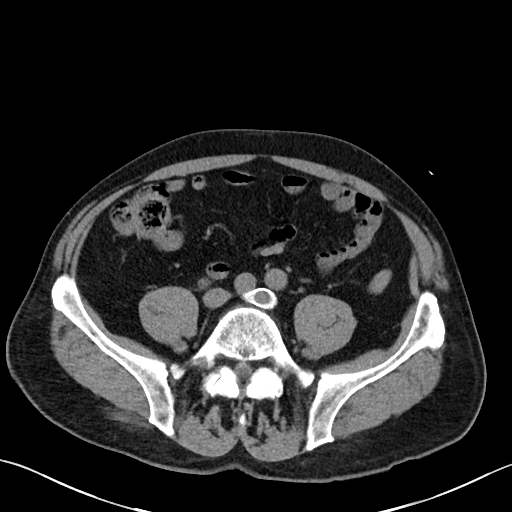
[im 54/98  soft-tissue]
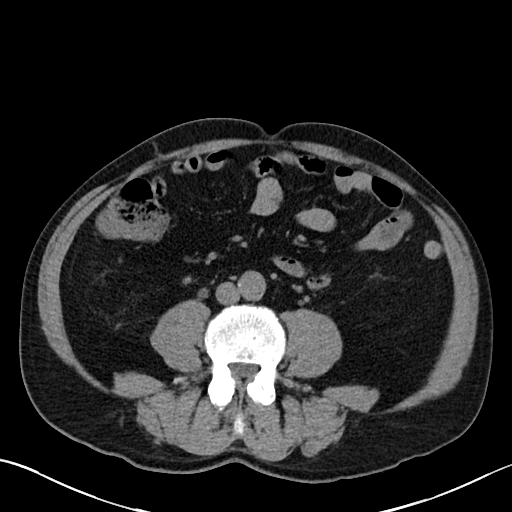
[im 59/98  soft-tissue]
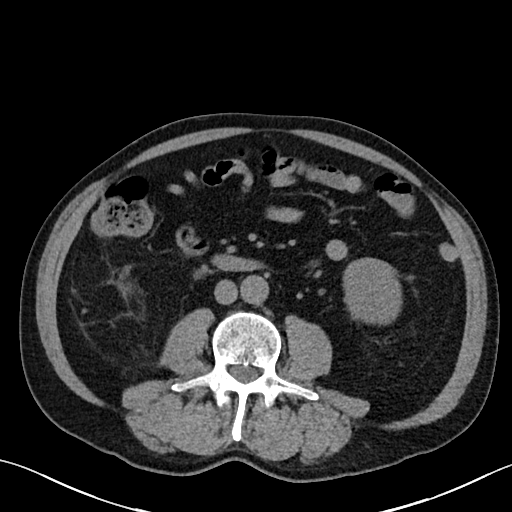
[im 68/98  soft-tissue]
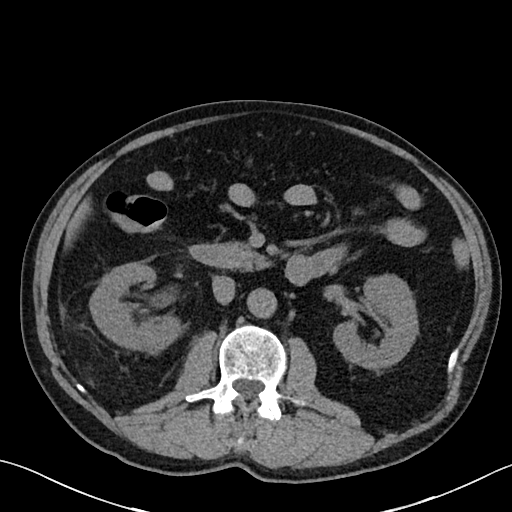
[im 68/98  bone]
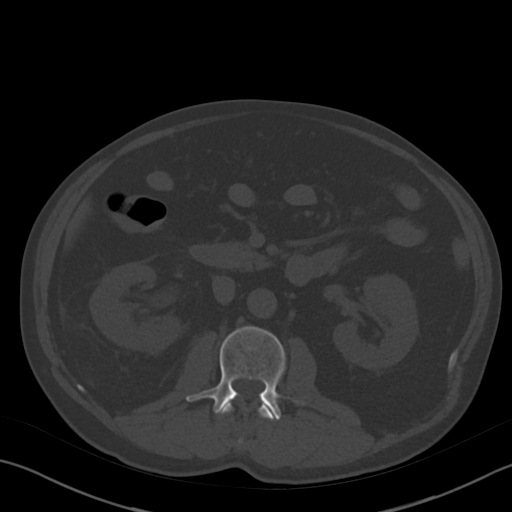
[im 78/98  soft-tissue]
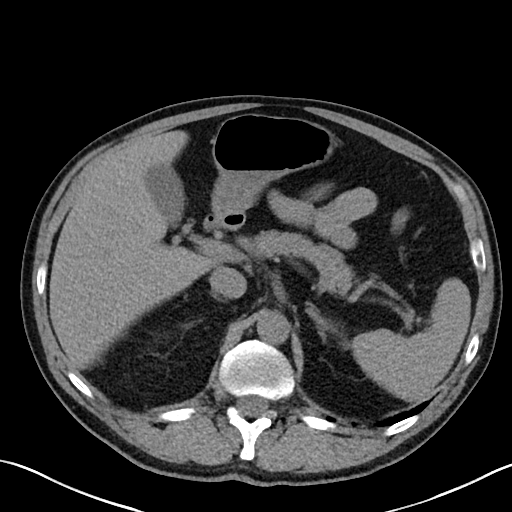
[im 83/98  soft-tissue]
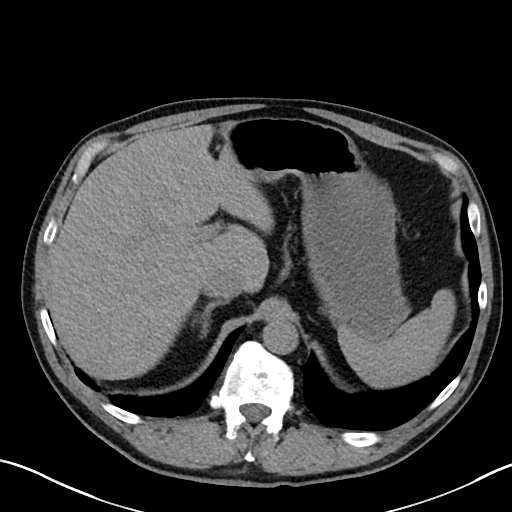
[im 93/98  soft-tissue]
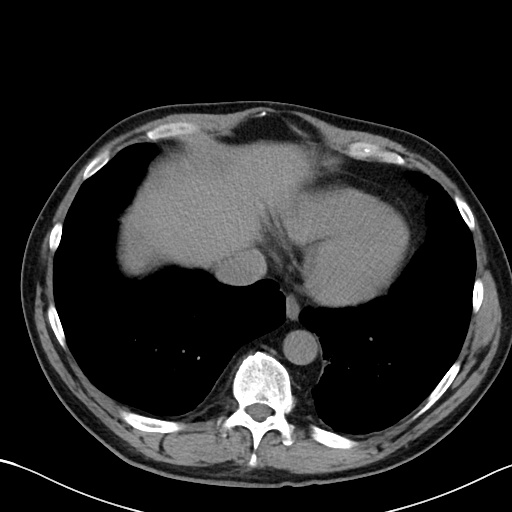

[Series 6: cor · coronal · 0.80mm/px · 3 of 101 slices shown]
[im 34/101  soft-tissue]
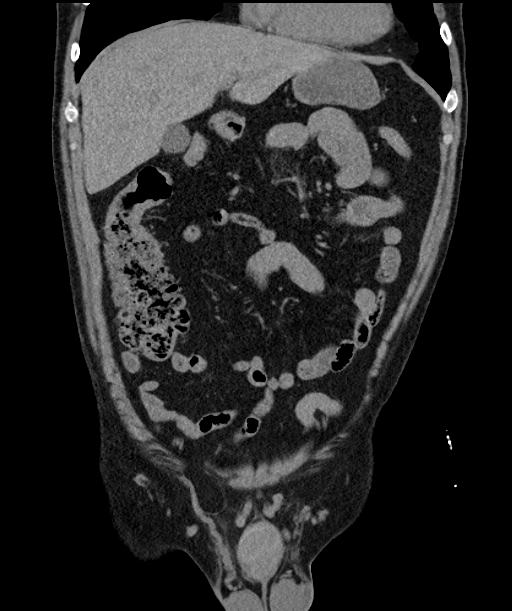
[im 45/101  soft-tissue]
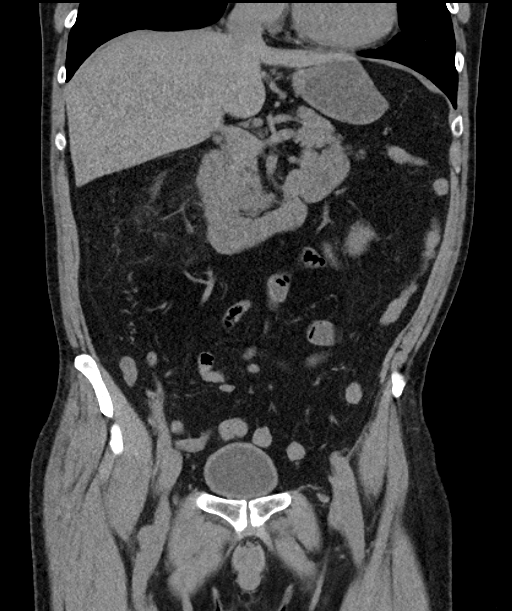
[im 56/101  soft-tissue]
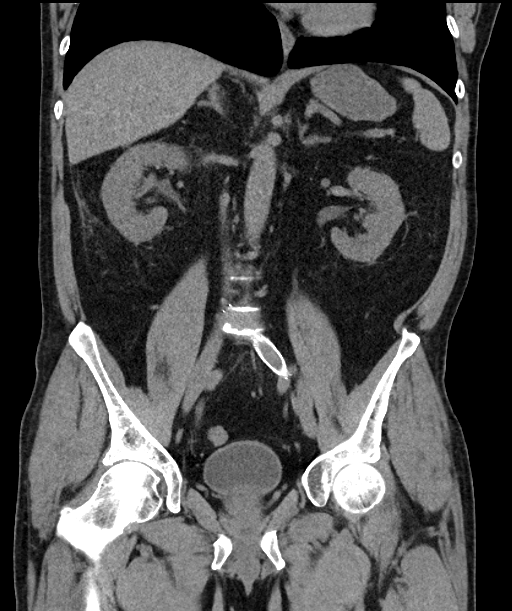

[15 of 46 positions shown; findings below may reference images not displayed]

FINDINGS: Lower chest: 4 mm subpleural nodule present at the peripheral left
lower lobe (series 5, image 25), indeterminate. Minimal scattered
bibasilar atelectatic changes. Visualized lungs are otherwise clear.

Hepatobiliary: Liver demonstrates a normal unenhanced appearance.
Gallbladder within normal limits. No biliary dilatation.

Pancreas: Pancreas within normal limits.

Spleen: Spleen within normal limits.

Adrenals/Urinary Tract: Adrenal glands are normal.

Left kidney unremarkable without nephrolithiasis or hydronephrosis.
No radiopaque torcula seen along the course of the left renal
collecting system. No left-sided hydroureter.

On the right, there is an obstructive 3 mm stone position within the
distal right ureter just proximal to the right UVJ. Secondary mild
right hydroureteronephrosis. Associated perinephric and periureteral
fat stranding. No other radiopaque calculi seen within the right
kidney along the course of the right renal collecting system.

Bladder partially distended without acute abnormality. No layering
stones within the bladder lumen.

Stomach/Bowel: Stomach within normal limits. No evidence for bowel
obstruction. Appendix normal. No acute inflammatory changes seen
about the bowels.

Vascular/Lymphatic: Intra-abdominal aorta of normal caliber. Mild
aortic atherosclerosis. Stent in place within the left common iliac
vein. Scattered venous collaterals present within the subcutaneous
fat of the lower pelvis. No adenopathy.

Reproductive: Prostate mildly enlarged measuring 5.3 cm in
transverse diameter.

Other: Small bilateral fat containing inguinal hernias noted,
slightly larger on the right. No free air or fluid.

Musculoskeletal: No acute osseous abnormality. No worrisome lytic or
blastic osseous lesions. Mild height loss at the superior endplate
of T12 is chronic in appearance.
IMPRESSION: 1. 3 mm obstructive stone within the distal right ureter with
secondary mild right hydroureteronephrosis.
2. No other acute intra-abdominal or pelvic process identified.
3. 4 mm subpleural left lower lobe pulmonary nodule, indeterminate.
No follow-up needed if patient is low-risk. Non-contrast chest CT
can be considered in 12 months if patient is high-risk. This
recommendation follows the consensus statement: Guidelines for
Management of Incidental Pulmonary Nodules Detected on CT Images:

## 2018-04-30 ENCOUNTER — Ambulatory Visit: Payer: Medicare HMO | Admitting: Family Medicine

## 2018-07-09 DIAGNOSIS — F411 Generalized anxiety disorder: Secondary | ICD-10-CM | POA: Diagnosis not present

## 2018-07-09 DIAGNOSIS — R69 Illness, unspecified: Secondary | ICD-10-CM | POA: Diagnosis not present

## 2018-07-30 DIAGNOSIS — F411 Generalized anxiety disorder: Secondary | ICD-10-CM | POA: Diagnosis not present

## 2018-07-30 DIAGNOSIS — R69 Illness, unspecified: Secondary | ICD-10-CM | POA: Diagnosis not present

## 2018-09-07 DIAGNOSIS — F411 Generalized anxiety disorder: Secondary | ICD-10-CM | POA: Diagnosis not present

## 2018-09-07 DIAGNOSIS — R69 Illness, unspecified: Secondary | ICD-10-CM | POA: Diagnosis not present

## 2018-11-13 ENCOUNTER — Other Ambulatory Visit: Payer: Self-pay | Admitting: Family Medicine

## 2018-11-13 NOTE — Telephone Encounter (Signed)
Please advise 

## 2018-11-16 DIAGNOSIS — F411 Generalized anxiety disorder: Secondary | ICD-10-CM | POA: Diagnosis not present

## 2018-11-16 DIAGNOSIS — F1110 Opioid abuse, uncomplicated: Secondary | ICD-10-CM | POA: Diagnosis not present

## 2018-11-16 DIAGNOSIS — R69 Illness, unspecified: Secondary | ICD-10-CM | POA: Diagnosis not present

## 2018-11-16 DIAGNOSIS — F1220 Cannabis dependence, uncomplicated: Secondary | ICD-10-CM | POA: Diagnosis not present

## 2018-12-28 DIAGNOSIS — R7989 Other specified abnormal findings of blood chemistry: Secondary | ICD-10-CM

## 2018-12-28 DIAGNOSIS — E538 Deficiency of other specified B group vitamins: Secondary | ICD-10-CM

## 2018-12-28 DIAGNOSIS — E559 Vitamin D deficiency, unspecified: Secondary | ICD-10-CM

## 2018-12-28 HISTORY — DX: Vitamin D deficiency, unspecified: E55.9

## 2018-12-28 HISTORY — DX: Other specified abnormal findings of blood chemistry: R79.89

## 2018-12-28 HISTORY — DX: Deficiency of other specified B group vitamins: E53.8

## 2019-01-05 ENCOUNTER — Ambulatory Visit (INDEPENDENT_AMBULATORY_CARE_PROVIDER_SITE_OTHER): Payer: Medicare HMO | Admitting: Family Medicine

## 2019-01-05 ENCOUNTER — Other Ambulatory Visit: Payer: Self-pay

## 2019-01-05 VITALS — BP 124/72 | HR 74 | Temp 98.1°F | Ht 70.0 in | Wt 188.2 lb

## 2019-01-05 DIAGNOSIS — Z131 Encounter for screening for diabetes mellitus: Secondary | ICD-10-CM | POA: Diagnosis not present

## 2019-01-05 DIAGNOSIS — Z1211 Encounter for screening for malignant neoplasm of colon: Secondary | ICD-10-CM | POA: Diagnosis not present

## 2019-01-05 DIAGNOSIS — Z09 Encounter for follow-up examination after completed treatment for conditions other than malignant neoplasm: Secondary | ICD-10-CM

## 2019-01-05 DIAGNOSIS — I10 Essential (primary) hypertension: Secondary | ICD-10-CM | POA: Diagnosis not present

## 2019-01-05 DIAGNOSIS — F319 Bipolar disorder, unspecified: Secondary | ICD-10-CM | POA: Diagnosis not present

## 2019-01-05 DIAGNOSIS — R69 Illness, unspecified: Secondary | ICD-10-CM | POA: Diagnosis not present

## 2019-01-05 LAB — POCT GLYCOSYLATED HEMOGLOBIN (HGB A1C): Hemoglobin A1C: 5.4 % (ref 4.0–5.6)

## 2019-01-05 LAB — POCT URINALYSIS DIP (MANUAL ENTRY)
Bilirubin, UA: NEGATIVE
Blood, UA: NEGATIVE
Glucose, UA: NEGATIVE mg/dL
Leukocytes, UA: NEGATIVE
Nitrite, UA: NEGATIVE
Protein Ur, POC: NEGATIVE mg/dL
Spec Grav, UA: 1.025 (ref 1.010–1.025)
Urobilinogen, UA: 0.2 E.U./dL
pH, UA: 5 (ref 5.0–8.0)

## 2019-01-05 MED ORDER — AMLODIPINE BESYLATE 10 MG PO TABS: 10.0000 mg | ORAL_TABLET | Freq: Every day | ORAL | 1 refills | Status: DC

## 2019-01-05 NOTE — Progress Notes (Signed)
[facility]   Established Patient Office Visit  Subjective:  Patient ID: Omar Rogers, male    DOB: 03-12-1951  Age: 68 y.o. MRN: [id number]  CC:  Chief Complaint  Patient presents with  . Urinary Frequency  . Follow-up    HTN, refills    HPI Omar Omar Rogers is a 68 year old male who presents for Follow Up today.   Past Medical History:  Diagnosis Date  . Bipolar disorder (HCC)   . DVT (deep venous thrombosis) (HCC) 06/29/2013   extensive left iliofemoral popliteal DVT/notes (06/29/2013)  . Increased thyroid stimulating hormone (TSH) level 12/2018  . 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 Vitamin B 12 deficiency 12/2018  . Vitamin D deficiency 12/2018   Current Status: This is myince his last office visit, he is doing well with no complaints. His anxiety is stable today. He denies suicidal ideations, homicidal ideations, or auditory hallucinations. He was formerly attending [facility] in [place], where her was seeing a Psychiatrist. He has not followed up as of yet.   He denies fevers, chills, fatigue, recent infections, weight loss, and night sweats. He has not had any headaches, visual changes, dizziness, and falls. No chest pain, heart palpitations, cough and shortness of breath reported. No reports of GI problems such as nausea, vomiting, diarrhea, and constipation. He has no reports of blood in stools, dysuria and hematuria. He denies pain today.   Past Surgical History:  Procedure Laterality Date  . ANKLE FRACTURE SURGERY Right ~ 2012    Family History  Problem Relation Age of Onset  . Hypertension Mother   . Cancer Mother   . Hypertension Father     Social History   Socioeconomic History  . Marital status: Single    Spouse name: Not on file  . Number of children: Not on file  . Years of education: Not on file  . Highest education level: Not on file  Occupational History  . Not on file  Social Needs   . Financial resource strain: Not on file  . Food insecurity    Worry: Not on file    Inability: Not on file  . Transportation needs    Medical: Not on file    Non-medical: Not on file  Tobacco Use  . Smoking status: Former Smoker    Packs/day: 0.50    Years: 5.00    Pack years: 2.50    Types: Cigarettes  . Smokeless tobacco: Never Used  . Tobacco comment: 06/29/2013 "quit smoking ~ 2 yr ago"  Substance and Sexual Activity  . Alcohol use: Yes    Alcohol/week: 42.0 standard drinks    Types: 42 Cans of beer per week  . Drug use: No  . Sexual activity: Not Currently  Lifestyle  . Physical activity    Days per week: Not on file    Minutes per session: Not on file  . Stress: Not on file  Relationships  . Social Musicianconnections    Talks on phone: Not on file    Gets together: Not on file    Attends religious service: Not on file    Active member of club or organization: Not on file    Attends meetings of clubs or organizations: Not on file    Relationship status: Not on file  . Intimate partner violence    Fear of current or ex partner: Not on file    Emotionally abused: Not on file  Physically abused: Not on file    Forced sexual activity: Not on file  Other Topics Concern  . Not on file  Social History Narrative  . Not on file    Outpatient Medications Prior to Visit  Medication Sig Dispense Refill  . Multiple Vitamins-Minerals (MULTIVITAMIN WITH MINERALS) tablet Take 1 tablet by mouth daily.    Marland Kitchen. SAPHRIS 10 MG SUBL Place 10 mg under the tongue daily.    Marland Kitchen. amLODipine (NORVASC) 10 MG tablet Take 1 tablet (10 mg total) by mouth daily. 90 tablet 3  . asenapine (SAPHRIS) 5 MG SUBL 24 hr tablet Place 10 mg under the tongue at bedtime.    Marland Kitchen. HYDROcodone-acetaminophen (NORCO/VICODIN) 5-325 MG tablet Take 1 tablet by mouth every 4 (four) hours as needed. (Patient not taking: Reported on 10/29/2017) 12 tablet 0  . lamoTRIgine (LAMICTAL) 100 MG tablet Take 100 mg by mouth daily.      No facility-administered medications prior to visit.     Allergies  Allergen Reactions  . Chlorpromazine Other (See Comments)    unknown reaction- reported from previous hospital records  . Haloperidol Other (See Comments)    Unknown reaction-reported via hospital records    ROS Review of Systems  Constitutional: Negative.   HENT: Negative.   Eyes: Negative.   Respiratory: Negative.   Cardiovascular: Negative.   Gastrointestinal: Negative.   Endocrine: Negative.   Genitourinary: Negative.   Musculoskeletal: Negative.   Skin: Negative.   Allergic/Immunologic: Negative.   Neurological: Negative.   Hematological: Negative.   Psychiatric/Behavioral: Negative.       Objective:    Physical Exam  Constitutional: He is oriented to person, place, and time. He appears well-developed and well-nourished.  HENT:  Head: Normocephalic and atraumatic.  Eyes: Conjunctivae are normal.  Neck: Normal range of motion. Neck supple.  Cardiovascular: Normal rate, regular rhythm, normal heart sounds and intact distal pulses.  Pulmonary/Chest: Effort normal and breath sounds normal.  Abdominal: Soft. Bowel sounds are normal.  Musculoskeletal: Normal range of motion.  Neurological: He is alert and oriented to person, place, and time. He has normal reflexes.  Skin: Skin is warm and dry.  Psychiatric: He has a normal mood and affect. His behavior is normal. Judgment and thought content normal.  Nursing note and vitals reviewed.   BP 124/72 (BP Location: Left Arm, Patient Position: Sitting, Cuff Size: Small)   Pulse 74   Temp 98.1 F (36.7 C) (Oral)   Ht 5\' 10"  (1.778 m)   Wt 188 lb 3.2 oz (85.4 kg)   SpO2 100%   BMI 27.00 kg/m  Wt Readings from Last 3 Encounters:  01/05/19 188 lb 3.2 oz (85.4 kg)  10/29/17 185 lb (83.9 kg)  06/23/17 180 lb (81.6 kg)     Health Maintenance Due  Topic Date Due  . COLONOSCOPY  06/17/2003    There are no preventive care reminders to display for  this patient.  Lab Results  Component Value Date   TSH 5.270 (H) 01/05/2019   Lab Results  Component Value Date   WBC 6.6 01/05/2019   HGB 16.8 01/05/2019   HCT 47.5 01/05/2019   MCV 99 (H) 01/05/2019   PLT 190 01/05/2019   Lab Results  Component Value Date   NA 138 01/05/2019   K 4.3 01/05/2019   CO2 21 01/05/2019   GLUCOSE 99 01/05/2019   BUN 10 01/05/2019   CREATININE 0.88 01/05/2019   BILITOT 0.7 01/05/2019   ALKPHOS 84 01/05/2019  AST 30 01/05/2019   ALT 26 01/05/2019   PROT 6.6 01/05/2019   ALBUMIN 4.3 01/05/2019   CALCIUM 9.6 01/05/2019   ANIONGAP 7 06/23/2017   Lab Results  Component Value Date   CHOL 247 (H) 01/05/2019   Lab Results  Component Value Date   HDL 48 01/05/2019   Lab Results  Component Value Date   LDLCALC 163 (H) 01/05/2019   Lab Results  Component Value Date   TRIG 179 (H) 01/05/2019   Lab Results  Component Value Date   CHOLHDL 5.1 (H) 01/05/2019   Lab Results  Component Value Date   HGBA1C 5.4 01/05/2019      Assessment & Plan:   1. Screening for diabetes mellitus The current medical regimen is effective; Hgb A1c is stable at 5.4 today; continue present plan and medications as prescribed.  She will continue to decrease foods/beverages high in sugars and carbs and follow Heart Healthy or DASH diet. Increase physical activity to at least 30 minutes cardio exercise daily.  - POCT glycosylated hemoglobin (Hb A1C) - POCT urinalysis dipstick  2. Essential hypertension, benign The current medical regimen is effective; blood pressure is stable at 124/72 today; continue present plan and medications as prescribed. She will continue to decrease high sodium intake, excessive alcohol intake, increase potassium intake, smoking cessation, and increase physical activity of at least 30 minutes of cardio activity daily. She will continue to follow Heart Healthy or DASH diet. - CBC with Differentia/l - Comprehensive metabolic panel - TSH -  Lipid Panel - PSA - Vitamin D, 25-hydroxy - Vitamin B12 - amLODipine (NORVASC) 10 MG tablet; Take 1 tablet (10 mg total) by mouth daily.  Dispense: 90 tablet; Refill: 1  3. Screening for colon cancer We will assess for Colorectal Cancer screening.   4. Bipolar affective disorder, remission status unspecified (HCC) Stable today. He recently discontinued Saphris mg subl. We will assess for referral to Psychiatry.   5. Follow up He will follow up in 6 months.   Meds ordered this encounter  Medications  . amLODipine (NORVASC) 10 MG tablet    Sig: Take 1 tablet (10 mg total) by mouth daily.    Dispense:  90 tablet    Refill:  1    Orders Placed This Encounter  Procedures  . CBC with Differential  . Comprehensive metabolic panel  . TSH  . Lipid Panel  . PSA  . Vitamin D, 25-hydroxy  . Vitamin B12  . POCT glycosylated hemoglobin (Hb A1C)  . POCT urinalysis dipstick    Referral Orders  No referral(s) requested today    Raliegh IpNatalie Seniyah Esker,  MSN, FNP-BC [facility] [address]  [place], [place] [id number] [phone]   Problem List Items Addressed This Visit      Cardiovascular and Mediastinum   Essential hypertension, benign   Relevant Medications   amLODipine (NORVASC) 10 MG tablet   Other Relevant Orders   CBC with Differential (Completed)   Comprehensive metabolic panel (Completed)   TSH (Completed)   Lipid Panel (Completed)   PSA (Completed)   Vitamin D, 25-hydroxy (Completed)   Vitamin B12 (Completed)     Other   Bipolar disorder, unspecified (HCC)    Other Visit Diagnoses    Screening for diabetes mellitus    -  Primary   Relevant Orders   POCT glycosylated hemoglobin (Hb A1C) (Completed)   POCT urinalysis dipstick (Completed)   Screening for colon cancer  Meds ordered this encounter  Medications  . amLODipine (NORVASC) 10 MG tablet    Sig: Take 1 tablet (10 mg total) by mouth daily.    Dispense:  90  tablet    Refill:  1    Follow-up: Return in about 6 months (around 07/07/2019).    Azzie Glatter, FNP

## 2019-01-06 ENCOUNTER — Other Ambulatory Visit: Payer: Self-pay | Admitting: Family Medicine

## 2019-01-06 ENCOUNTER — Encounter: Payer: Self-pay | Admitting: Family Medicine

## 2019-01-06 DIAGNOSIS — E0590 Thyrotoxicosis, unspecified without thyrotoxic crisis or storm: Secondary | ICD-10-CM

## 2019-01-06 DIAGNOSIS — E785 Hyperlipidemia, unspecified: Secondary | ICD-10-CM

## 2019-01-06 LAB — CBC WITH DIFFERENTIAL/PLATELET
Basophils Absolute: 0 10*3/uL (ref 0.0–0.2)
Basos: 1 %
EOS (ABSOLUTE): 0.2 10*3/uL (ref 0.0–0.4)
Eos: 3 %
Hematocrit: 47.5 % (ref 37.5–51.0)
Hemoglobin: 16.8 g/dL (ref 13.0–17.7)
Immature Grans (Abs): 0 10*3/uL (ref 0.0–0.1)
Immature Granulocytes: 0 %
Lymphocytes Absolute: 1.1 10*3/uL (ref 0.7–3.1)
Lymphs: 17 %
MCH: 35.1 pg — ABNORMAL HIGH (ref 26.6–33.0)
MCHC: 35.4 g/dL (ref 31.5–35.7)
MCV: 99 fL — ABNORMAL HIGH (ref 79–97)
Monocytes Absolute: 0.7 10*3/uL (ref 0.1–0.9)
Monocytes: 11 %
Neutrophils Absolute: 4.5 10*3/uL (ref 1.4–7.0)
Neutrophils: 68 %
Platelets: 190 10*3/uL (ref 150–450)
RBC: 4.79 x10E6/uL (ref 4.14–5.80)
RDW: 12.7 % (ref 11.6–15.4)
WBC: 6.6 10*3/uL (ref 3.4–10.8)

## 2019-01-06 LAB — VITAMIN D 25 HYDROXY (VIT D DEFICIENCY, FRACTURES): Vit D, 25-Hydroxy: 21.9 ng/mL — ABNORMAL LOW (ref 30.0–100.0)

## 2019-01-06 LAB — LIPID PANEL
Chol/HDL Ratio: 5.1 ratio — ABNORMAL HIGH (ref 0.0–5.0)
Cholesterol, Total: 247 mg/dL — ABNORMAL HIGH (ref 100–199)
HDL: 48 mg/dL (ref >39)
LDL Calculated: 163 mg/dL — ABNORMAL HIGH (ref 0–99)
Triglycerides: 179 mg/dL — ABNORMAL HIGH (ref 0–149)
VLDL Cholesterol Cal: 36 mg/dL (ref 5–40)

## 2019-01-06 LAB — COMPREHENSIVE METABOLIC PANEL
ALT: 26 IU/L (ref 0–44)
AST: 30 IU/L (ref 0–40)
Albumin/Globulin Ratio: 1.9 (ref 1.2–2.2)
Albumin: 4.3 g/dL (ref 3.8–4.8)
Alkaline Phosphatase: 84 IU/L (ref 39–117)
BUN/Creatinine Ratio: 11 (ref 10–24)
BUN: 10 mg/dL (ref 8–27)
Bilirubin Total: 0.7 mg/dL (ref 0.0–1.2)
CO2: 21 mmol/L (ref 20–29)
Calcium: 9.6 mg/dL (ref 8.6–10.2)
Chloride: 101 mmol/L (ref 96–106)
Creatinine, Ser: 0.88 mg/dL (ref 0.76–1.27)
GFR calc Af Amer: 104 mL/min/{1.73_m2} (ref >59)
GFR calc non Af Amer: 90 mL/min/{1.73_m2} (ref >59)
Globulin, Total: 2.3 g/dL (ref 1.5–4.5)
Glucose: 99 mg/dL (ref 65–99)
Potassium: 4.3 mmol/L (ref 3.5–5.2)
Sodium: 138 mmol/L (ref 134–144)
Total Protein: 6.6 g/dL (ref 6.0–8.5)

## 2019-01-06 LAB — VITAMIN B12: Vitamin B-12: 180 pg/mL — ABNORMAL LOW (ref 232–1245)

## 2019-01-06 LAB — PSA: Prostate Specific Ag, Serum: 2.8 ng/mL (ref 0.0–4.0)

## 2019-01-06 LAB — TSH: TSH: 5.27 u[IU]/mL — ABNORMAL HIGH (ref 0.450–4.500)

## 2019-01-06 MED ORDER — ATORVASTATIN CALCIUM 10 MG PO TABS: 10.0000 mg | ORAL_TABLET | Freq: Every day | ORAL | 1 refills | Status: DC

## 2019-01-18 ENCOUNTER — Telehealth: Payer: Self-pay | Admitting: Family Medicine

## 2019-01-18 NOTE — Telephone Encounter (Signed)
Discussed benefits of screening for Colorectal Cancer, which patient denied at this time. Also, assessed if patient would like referral to Psychiatrist for Bi-Polar Disorder. His recently took last dosage of Saphris around 10/2018. He was receiving from treatment center. Omar Rogers denied referral at this time. He is to contact office if he needs further assistance. Verbalized understanding.   Kathe Becton,  MSN, FNP-BC [facility] [address] [place], [place] [phone]

## 2019-01-18 NOTE — Telephone Encounter (Signed)
-----   Message from Azzie Glatter, [place] sent at 01/07/2019  3:06 PM EDT ----- Regarding: "Referrals" Assess for referral for Colonoscopy Assess for referral to Psyhchiatry

## 2019-02-27 DIAGNOSIS — E039 Hypothyroidism, unspecified: Secondary | ICD-10-CM

## 2019-02-27 HISTORY — DX: Hypothyroidism, unspecified: E03.9

## 2019-03-08 ENCOUNTER — Other Ambulatory Visit: Payer: Medicare HMO

## 2019-03-08 ENCOUNTER — Other Ambulatory Visit: Payer: Self-pay

## 2019-03-08 DIAGNOSIS — E0590 Thyrotoxicosis, unspecified without thyrotoxic crisis or storm: Secondary | ICD-10-CM

## 2019-03-09 ENCOUNTER — Encounter: Payer: Self-pay | Admitting: Family Medicine

## 2019-03-09 LAB — THYROID PANEL WITH TSH
Free Thyroxine Index: 1.5 (ref 1.2–4.9)
T3 Uptake Ratio: 28 % (ref 24–39)
T4, Total: 5.2 ug/dL (ref 4.5–12.0)
TSH: 6.79 u[IU]/mL — ABNORMAL HIGH (ref 0.450–4.500)

## 2019-03-12 ENCOUNTER — Other Ambulatory Visit: Payer: Self-pay | Admitting: Family Medicine

## 2019-03-15 ENCOUNTER — Telehealth: Payer: Self-pay

## 2019-03-15 ENCOUNTER — Other Ambulatory Visit: Payer: Self-pay | Admitting: Family Medicine

## 2019-03-15 DIAGNOSIS — I10 Essential (primary) hypertension: Secondary | ICD-10-CM

## 2019-03-15 DIAGNOSIS — E039 Hypothyroidism, unspecified: Secondary | ICD-10-CM

## 2019-03-15 DIAGNOSIS — E785 Hyperlipidemia, unspecified: Secondary | ICD-10-CM

## 2019-03-15 MED ORDER — ATORVASTATIN CALCIUM 10 MG PO TABS: 10.0000 mg | ORAL_TABLET | Freq: Every day | ORAL | 1 refills | Status: DC

## 2019-03-15 MED ORDER — AMLODIPINE BESYLATE 10 MG PO TABS
10.0000 mg | ORAL_TABLET | Freq: Every day | ORAL | 1 refills | Status: DC
Start: 2019-03-15 — End: 2019-09-06

## 2019-03-15 MED ORDER — LEVOTHYROXINE SODIUM 137 MCG PO TABS: 137.0000 ug | ORAL_TABLET | Freq: Every day | ORAL | 1 refills | Status: DC

## 2019-05-17 DIAGNOSIS — Z8249 Family history of ischemic heart disease and other diseases of the circulatory system: Secondary | ICD-10-CM | POA: Diagnosis not present

## 2019-05-17 DIAGNOSIS — I10 Essential (primary) hypertension: Secondary | ICD-10-CM | POA: Diagnosis not present

## 2019-05-17 DIAGNOSIS — E039 Hypothyroidism, unspecified: Secondary | ICD-10-CM | POA: Diagnosis not present

## 2019-05-17 DIAGNOSIS — R69 Illness, unspecified: Secondary | ICD-10-CM | POA: Diagnosis not present

## 2019-05-17 DIAGNOSIS — Z87891 Personal history of nicotine dependence: Secondary | ICD-10-CM | POA: Diagnosis not present

## 2019-05-17 DIAGNOSIS — E785 Hyperlipidemia, unspecified: Secondary | ICD-10-CM | POA: Diagnosis not present

## 2019-05-17 DIAGNOSIS — Z008 Encounter for other general examination: Secondary | ICD-10-CM | POA: Diagnosis not present

## 2019-05-31 ENCOUNTER — Telehealth: Payer: Self-pay | Admitting: Family Medicine

## 2019-05-31 NOTE — Telephone Encounter (Signed)
Mr. Standage reports trouble sleeping. He has been seen by the [facility] for trouble sleeping. Patient will call the [facility] back for an appointment to see the therapist. He was advised to come in today or call for a sooner appointment,  if needed.

## 2019-06-03 DIAGNOSIS — R69 Illness, unspecified: Secondary | ICD-10-CM | POA: Diagnosis not present

## 2019-06-17 DIAGNOSIS — R69 Illness, unspecified: Secondary | ICD-10-CM | POA: Diagnosis not present

## 2019-07-07 ENCOUNTER — Ambulatory Visit (INDEPENDENT_AMBULATORY_CARE_PROVIDER_SITE_OTHER): Payer: Medicare HMO | Admitting: Family Medicine

## 2019-07-07 ENCOUNTER — Other Ambulatory Visit: Payer: Self-pay

## 2019-07-07 ENCOUNTER — Encounter: Payer: Self-pay | Admitting: Family Medicine

## 2019-07-07 VITALS — BP 124/81 | HR 81 | Temp 98.0°F | Wt 198.0 lb

## 2019-07-07 DIAGNOSIS — R69 Illness, unspecified: Secondary | ICD-10-CM | POA: Diagnosis not present

## 2019-07-07 DIAGNOSIS — I10 Essential (primary) hypertension: Secondary | ICD-10-CM

## 2019-07-07 DIAGNOSIS — E039 Hypothyroidism, unspecified: Secondary | ICD-10-CM

## 2019-07-07 DIAGNOSIS — Z131 Encounter for screening for diabetes mellitus: Secondary | ICD-10-CM

## 2019-07-07 DIAGNOSIS — R739 Hyperglycemia, unspecified: Secondary | ICD-10-CM | POA: Diagnosis not present

## 2019-07-07 DIAGNOSIS — F319 Bipolar disorder, unspecified: Secondary | ICD-10-CM | POA: Diagnosis not present

## 2019-07-07 DIAGNOSIS — Z09 Encounter for follow-up examination after completed treatment for conditions other than malignant neoplasm: Secondary | ICD-10-CM

## 2019-07-07 LAB — POCT URINALYSIS DIPSTICK
Bilirubin, UA: NEGATIVE
Blood, UA: NEGATIVE
Glucose, UA: NEGATIVE
Ketones, UA: NEGATIVE
Leukocytes, UA: NEGATIVE
Nitrite, UA: NEGATIVE
Protein, UA: NEGATIVE
Spec Grav, UA: >=1.03 — AB (ref 1.010–1.025)
Urobilinogen, UA: 0.2 E.U./dL
pH, UA: 5.5 (ref 5.0–8.0)

## 2019-07-07 LAB — GLUCOSE, POCT (MANUAL RESULT ENTRY): POC Glucose: 148 mg/dl — AB (ref 70–99)

## 2019-07-07 LAB — POCT GLYCOSYLATED HEMOGLOBIN (HGB A1C): Hemoglobin A1C: 5.3 % (ref 4.0–5.6)

## 2019-07-07 NOTE — Progress Notes (Signed)
[facility]   Established Patient Office Visit  Subjective:  Patient ID: Omar Rogers, male    DOB: 09-30-1950  Age: 68 y.o. MRN: [id number]  CC:  Chief Complaint  Patient presents with  . Follow-up    6 mth follow up DM    HPI Omar Rogers is a 68 year old male who presents for Follow Up today.   Past Medical History:  Diagnosis Date  . Bipolar disorder ([place])   . DVT (deep venous thrombosis) ([place]) 06/29/2013   extensive left iliofemoral popliteal DVT/notes (06/29/2013)  . Hypothyroidism 02/2019  . Increased thyroid stimulating hormone (TSH) level 12/2018  . 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 Vitamin B 12 deficiency 12/2018  . Vitamin D deficiency 12/2018   Current Status: Since his last office visit, he is doing well with no complaints.  His anxiety is mild today. He is currently following up with [facility] for Psychiatry. He denies suicidal ideations, homicidal ideations, or auditory hallucinations. He denies fevers, chills, fatigue, recent infections, weight loss, and night sweats. He has not had any headaches, visual changes, dizziness, and falls. No chest pain, heart palpitations, cough and shortness of breath reported. No reports of GI problems such as nausea, vomiting, diarrhea, and constipation. He has no reports of blood in stools, dysuria and hematuria. He denies pain today.   Past Surgical History:  Procedure Laterality Date  . ANKLE FRACTURE SURGERY Right ~ 2012    Family History  Problem Relation Age of Onset  . Hypertension Mother   . Cancer Mother   . Hypertension Father     Social History   Socioeconomic History  . Marital status: Single    Spouse name: Not on file  . Number of children: Not on file  . Years of education: Not on file  . Highest education level: Not on file  Occupational History  . Not on file  Social Needs  . Financial resource strain: Not on file  . Food insecurity   Worry: Not on file    Inability: Not on file  . Transportation needs    Medical: Not on file    Non-medical: Not on file  Tobacco Use  . Smoking status: Former Smoker    Packs/day: 0.50    Years: 5.00    Pack years: 2.50    Types: Cigarettes  . Smokeless tobacco: Never Used  . Tobacco comment: 06/29/2013 "quit smoking ~ 2 yr ago"  Substance and Sexual Activity  . Alcohol use: Yes    Alcohol/week: 42.0 standard drinks    Types: 42 Cans of beer per week  . Drug use: No  . Sexual activity: Not Currently  Lifestyle  . Physical activity    Days per week: Not on file    Minutes per session: Not on file  . Stress: Not on file  Relationships  . Social Herbalist on phone: Not on file    Gets together: Not on file    Attends religious service: Not on file    Active member of club or organization: Not on file    Attends meetings of clubs or organizations: Not on file    Relationship status: Not on file  . Intimate partner violence    Fear of current or ex partner: Not on file    Emotionally abused: Not on file    Physically abused: Not on file    Forced sexual activity: Not on  file  Other Topics Concern  . Not on file  Social History Narrative  . Not on file    Outpatient Medications Prior to Visit  Medication Sig Dispense Refill  . amLODipine (NORVASC) 10 MG tablet Take 1 tablet (10 mg total) by mouth daily. 90 tablet 1  . atorvastatin (LIPITOR) 10 MG tablet Take 1 tablet (10 mg total) by mouth daily. 90 tablet 1  . levothyroxine (SYNTHROID) 137 MCG tablet Take 1 tablet (137 mcg total) by mouth daily before breakfast. 90 tablet 1  . QUEtiapine (SEROQUEL) 100 MG tablet Take 100 mg by mouth at bedtime.    . Multiple Vitamins-Minerals (MULTIVITAMIN WITH MINERALS) tablet Take 1 tablet by mouth daily.    Marland Kitchen. SAPHRIS 10 MG SUBL Place 10 mg under the tongue daily.     No facility-administered medications prior to visit.     Allergies  Allergen Reactions  .  Chlorpromazine Other (See Comments)    unknown reaction- reported from previous hospital records  . Haloperidol Other (See Comments)    Unknown reaction-reported via hospital records    ROS Review of Systems  Constitutional: Negative.   HENT: Negative.   Eyes: Negative.   Respiratory: Negative.   Cardiovascular: Negative.   Gastrointestinal: Negative.   Endocrine: Negative.   Genitourinary: Negative.   Musculoskeletal: Negative.   Skin: Negative.   Allergic/Immunologic: Negative.   Neurological: Positive for dizziness (occasional ) and headaches (occasional ).  Hematological: Negative.   Psychiatric/Behavioral: Negative.       Objective:    Physical Exam  Constitutional: He is oriented to person, place, and time. He appears well-developed and well-nourished.  HENT:  Head: Normocephalic and atraumatic.  Eyes: Conjunctivae are normal.  Neck: Normal range of motion. Neck supple.  Cardiovascular: Normal rate, regular rhythm, normal heart sounds and intact distal pulses.  Pulmonary/Chest: Effort normal and breath sounds normal.  Abdominal: Soft. Bowel sounds are normal.  Musculoskeletal: Normal range of motion.  Neurological: He is alert and oriented to person, place, and time. He has normal reflexes.  Skin: Skin is warm and dry.  Psychiatric: He has a normal mood and affect. His behavior is normal. Judgment and thought content normal.  Nursing note and vitals reviewed.   BP 124/81   Pulse 81   Temp 98 F (36.7 C) (Oral)   Wt 198 lb (89.8 kg)   BMI 28.41 kg/m  Wt Readings from Last 3 Encounters:  07/07/19 198 lb (89.8 kg)  01/05/19 188 lb 3.2 oz (85.4 kg)  10/29/17 185 lb (83.9 kg)     Health Maintenance Due  Topic Date Due  . COLONOSCOPY  06/17/2003  . INFLUENZA VACCINE  02/27/2019    There are no preventive care reminders to display for this patient.  Lab Results  Component Value Date   TSH 6.790 (H) 03/08/2019   Lab Results  Component Value Date    WBC 6.6 01/05/2019   HGB 16.8 01/05/2019   HCT 47.5 01/05/2019   MCV 99 (H) 01/05/2019   PLT 190 01/05/2019   Lab Results  Component Value Date   NA 138 01/05/2019   K 4.3 01/05/2019   CO2 21 01/05/2019   GLUCOSE 99 01/05/2019   BUN 10 01/05/2019   CREATININE 0.88 01/05/2019   BILITOT 0.7 01/05/2019   ALKPHOS 84 01/05/2019   AST 30 01/05/2019   ALT 26 01/05/2019   PROT 6.6 01/05/2019   ALBUMIN 4.3 01/05/2019   CALCIUM 9.6 01/05/2019   ANIONGAP 7 06/23/2017  Lab Results  Component Value Date   CHOL 247 (H) 01/05/2019   Lab Results  Component Value Date   HDL 48 01/05/2019   Lab Results  Component Value Date   LDLCALC 163 (H) 01/05/2019   Lab Results  Component Value Date   TRIG 179 (H) 01/05/2019   Lab Results  Component Value Date   CHOLHDL 5.1 (H) 01/05/2019   Lab Results  Component Value Date   HGBA1C 5.3 07/07/2019    Assessment & Plan:   1. Bipolar affective disorder, remission status unspecified (HCC) Stable today. He will continue Psychiatric services with [facility] Continue Seroquel as prescribed.   2. Hyperglycemia Blood glucose is elevated at 148 today. He will continue medication as prescribed, to decrease foods/beverages high in sugars and carbs and follow Heart Healthy or DASH diet. Increase physical activity to at least 30 minutes cardio exercise daily.   3. Hypothyroidism, unspecified type  4. Essential hypertension, benign The current medical regimen is effective; blood pressure is stable at 124/81 today; continue present plan and medications as prescribed. He will continue to take medications as prescribed, to decrease high sodium intake, excessive alcohol intake, increase potassium intake, smoking cessation, and increase physical activity of at least 30 minutes of cardio activity daily. He will continue to follow Heart Healthy or DASH diet.  5. Screening for diabetes mellitus - POCT HgB A1C - Urinalysis Dipstick - Glucose (CBG)  6.  Follow up He will follow up in 6 months.   No orders of the defined types were placed in this encounter.   Orders Placed This Encounter  Procedures  . POCT HgB A1C  . Urinalysis Dipstick  . Glucose (CBG)    Orders Placed This Encounter  Procedures  . POCT HgB A1C  . Urinalysis Dipstick  . Glucose (CBG)    Referral Orders  No referral(s) requested today    Raliegh Ip,  MSN, FNP-BC [facility] [address] [place], [place] [address] [phone]- fax   Problem List Items Addressed This Visit      Cardiovascular and Mediastinum   Essential hypertension, benign     Other   Bipolar disorder, unspecified (HCC) - Primary    Other Visit Diagnoses    Hyperglycemia       Hypothyroidism, unspecified type       Screening for diabetes mellitus       Relevant Orders   POCT HgB A1C (Completed)   Urinalysis Dipstick (Completed)   Glucose (CBG) (Completed)   Follow up          No orders of the defined types were placed in this encounter.   Follow-up: Return in about 6 months (around 01/05/2020).    Kallie Locks, FNP

## 2019-08-12 DIAGNOSIS — R69 Illness, unspecified: Secondary | ICD-10-CM | POA: Diagnosis not present

## 2019-09-06 ENCOUNTER — Other Ambulatory Visit: Payer: Self-pay | Admitting: Family Medicine

## 2019-09-06 DIAGNOSIS — I10 Essential (primary) hypertension: Secondary | ICD-10-CM

## 2019-09-06 DIAGNOSIS — E785 Hyperlipidemia, unspecified: Secondary | ICD-10-CM

## 2019-09-06 DIAGNOSIS — E039 Hypothyroidism, unspecified: Secondary | ICD-10-CM

## 2019-09-30 ENCOUNTER — Ambulatory Visit: Payer: Medicare HMO | Attending: Internal Medicine

## 2019-09-30 DIAGNOSIS — Z23 Encounter for immunization: Secondary | ICD-10-CM

## 2019-09-30 NOTE — Progress Notes (Signed)
   Covid-19 Vaccination Clinic  Name:  TEDD COTTRILL    MRN: [id number] DOB: [birth year]  09/30/2019  Mr. Rog was observed post Covid-19 immunization for 15 minutes without incident. He was provided with Vaccine Information Sheet and instruction to access the V-Safe system.   Mr. Sigal was instructed to call 911 with any severe reactions post vaccine: Marland Kitchen Difficulty breathing  . Swelling of face and throat  . A fast heartbeat  . A bad rash all over body  . Dizziness and weakness   Immunizations Administered    Name Date Dose VIS Date Route   Pfizer COVID-19 Vaccine 09/30/2019  5:18 PM 0.3 mL 07/09/2019 Intramuscular   Manufacturer: [employer], [employer]   Lot: [id number]   NDC: [address]-6

## 2019-10-27 ENCOUNTER — Ambulatory Visit: Payer: Medicare HMO | Attending: Internal Medicine

## 2019-10-27 DIAGNOSIS — Z23 Encounter for immunization: Secondary | ICD-10-CM

## 2019-10-27 NOTE — Progress Notes (Signed)
   Covid-19 Vaccination Clinic  Name:  Omar Rogers    MRN: [id number] DOB: [birth year]  10/27/2019  Omar Rogers was observed post Covid-19 immunization for 15 minutes without incident. He was provided with Vaccine Information Sheet and instruction to access the V-Safe system.   Omar Rogers was instructed to call 911 with any severe reactions post vaccine: Marland Kitchen Difficulty breathing  . Swelling of face and throat  . A fast heartbeat  . A bad rash all over body  . Dizziness and weakness   Immunizations Administered    Name Date Dose VIS Date Route   Pfizer COVID-19 Vaccine 10/27/2019  3:31 PM 0.3 mL 07/09/2019 Intramuscular   Manufacturer: [employer], [employer]   Lot: [id number]   NDC: [address]-5

## 2019-11-04 DIAGNOSIS — R69 Illness, unspecified: Secondary | ICD-10-CM | POA: Diagnosis not present

## 2020-01-05 ENCOUNTER — Ambulatory Visit (INDEPENDENT_AMBULATORY_CARE_PROVIDER_SITE_OTHER): Payer: Medicare HMO | Admitting: Family Medicine

## 2020-01-05 ENCOUNTER — Other Ambulatory Visit: Payer: Self-pay

## 2020-01-05 ENCOUNTER — Encounter: Payer: Self-pay | Admitting: Family Medicine

## 2020-01-05 VITALS — BP 111/73 | HR 72 | Temp 97.8°F | Wt 200.0 lb

## 2020-01-05 DIAGNOSIS — I10 Essential (primary) hypertension: Secondary | ICD-10-CM

## 2020-01-05 DIAGNOSIS — Z0000 Encounter for general adult medical examination without abnormal findings: Secondary | ICD-10-CM

## 2020-01-05 DIAGNOSIS — R739 Hyperglycemia, unspecified: Secondary | ICD-10-CM | POA: Diagnosis not present

## 2020-01-05 DIAGNOSIS — M25511 Pain in right shoulder: Secondary | ICD-10-CM

## 2020-01-05 DIAGNOSIS — E039 Hypothyroidism, unspecified: Secondary | ICD-10-CM

## 2020-01-05 DIAGNOSIS — R82998 Other abnormal findings in urine: Secondary | ICD-10-CM

## 2020-01-05 DIAGNOSIS — Z09 Encounter for follow-up examination after completed treatment for conditions other than malignant neoplasm: Secondary | ICD-10-CM | POA: Diagnosis not present

## 2020-01-05 DIAGNOSIS — R69 Illness, unspecified: Secondary | ICD-10-CM | POA: Diagnosis not present

## 2020-01-05 DIAGNOSIS — F319 Bipolar disorder, unspecified: Secondary | ICD-10-CM | POA: Diagnosis not present

## 2020-01-05 LAB — POCT URINALYSIS DIPSTICK
Bilirubin, UA: NEGATIVE
Blood, UA: NEGATIVE
Glucose, UA: NEGATIVE
Ketones, UA: NEGATIVE
Nitrite, UA: NEGATIVE
Protein, UA: POSITIVE — AB
Spec Grav, UA: >=1.03 — AB (ref 1.010–1.025)
Urobilinogen, UA: 0.2 E.U./dL
pH, UA: 6 (ref 5.0–8.0)

## 2020-01-05 LAB — POCT GLYCOSYLATED HEMOGLOBIN (HGB A1C)
HbA1c POC (<> result, manual entry): 5.5 % (ref 4.0–5.6)
HbA1c, POC (controlled diabetic range): 5.5 % (ref 0.0–7.0)
HbA1c, POC (prediabetic range): 5.5 % — AB (ref 5.7–6.4)
Hemoglobin A1C: 5.5 % (ref 4.0–5.6)

## 2020-01-05 LAB — GLUCOSE, POCT (MANUAL RESULT ENTRY): POC Glucose: 119 mg/dl — AB (ref 70–99)

## 2020-01-05 NOTE — Patient Instructions (Signed)
Shoulder Pain Many things can cause shoulder pain, including:  An injury.  Moving the shoulder in the same way again and again (overuse).  Joint pain (arthritis). Pain can come from:  Swelling and irritation (inflammation) of any part of the shoulder.  An injury to the shoulder joint.  An injury to: ? Tissues that connect muscle to bone (tendons). ? Tissues that connect bones to each other (ligaments). ? Bones. Follow these instructions at home: Watch for changes in your symptoms. Let your doctor know about them. Follow these instructions to help with your pain. If you have a sling:  Wear the sling as told by your doctor. Remove it only as told by your doctor.  Loosen the sling if your fingers: ? Tingle. ? Become numb. ? Turn cold and blue.  Keep the sling clean.  If the sling is not waterproof: ? Do not let it get wet. ? Take the sling off when you shower or bathe. Managing pain, stiffness, and swelling   If told, put ice on the painful area: ? Put ice in a plastic bag. ? Place a towel between your skin and the bag. ? Leave the ice on for 20 minutes, 2-3 times a day. Stop putting ice on if it does not help with the pain.  Squeeze a soft ball or a foam pad as much as possible. This prevents swelling in the shoulder. It also helps to strengthen the arm. General instructions  Take over-the-counter and prescription medicines only as told by your doctor.  Keep all follow-up visits as told by your doctor. This is important. Contact a doctor if:  Your pain gets worse.  Medicine does not help your pain.  You have new pain in your arm, hand, or fingers. Get help right away if:  Your arm, hand, or fingers: ? Tingle. ? Are numb. ? Are swollen. ? Are painful. ? Turn white or blue. Summary  Shoulder pain can be caused by many things. These include injury, moving the shoulder in the same away again and again, and joint pain.  Watch for changes in your symptoms.  Let your doctor know about them.  This condition may be treated with a sling, ice, and pain medicine.  Contact your doctor if the pain gets worse or you have new pain. Get help right away if your arm, hand, or fingers tingle or get numb, swollen, or painful.  Keep all follow-up visits as told by your doctor. This is important. This information is not intended to replace advice given to you by your health care provider. Make sure you discuss any questions you have with your health care provider. Document Revised: 01/27/2018 Document Reviewed: 01/27/2018 Elsevier Patient Education  2020 [place] for Routine Care of Injuries Many injuries can be cared for with rest, ice, compression, and elevation (RICE therapy). This includes:  Resting the injured part.  Putting ice on the injury.  Putting pressure (compression) on the injury.  Raising the injured part (elevation). Using RICE therapy can help to lessen pain and swelling. Supplies needed:  Ice.  Plastic bag.  Towel.  Elastic bandage.  Pillow or pillows to raise (elevate) your injured body part. How to care for your injury with RICE therapy Rest Limit your normal activities, and try not to use the injured part of your body. You can go back to your normal activities when your doctor says it is okay to do them and you feel okay. Ask your doctor if you  should do exercises to help your injury get better. Ice Put ice on the injured area. Do not put ice on your bare skin.  Put ice in a plastic bag.  Place a towel between your skin and the bag.  Leave the ice on for 20 minutes, 2-3 times a day. Use ice on as many days as told by your doctor.  Compression Compression means putting pressure on the injured area. This can be done with an elastic bandage. If an elastic bandage has been put on your injury:  Do not wrap the bandage too tight. Wrap the bandage more loosely if part of your body away from the bandage is  blue, swollen, cold, painful, or loses feeling (gets numb).  Take off the bandage and put it on again. Do this every 3-4 hours or as told by your doctor.  See your doctor if the bandage seems to make your problems worse.  Elevation Elevation means keeping the injured area raised. If you can, raise the injured area above your heart or the center of your chest. Contact a doctor if:  You keep having pain and swelling.  Your symptoms get worse. Get help right away if:  You have sudden bad pain at your injury or lower than your injury.  You have redness or more swelling around your injury.  You have tingling or numbness at your injury or lower than your injury, and it does not go away when you take off the bandage. Summary  Many injuries can be cared for using rest, ice, compression, and elevation (RICE therapy).  You can go back to your normal activities when you feel okay and your doctor says it is okay.  Put ice on the injured area as told by your doctor.  Get help if your symptoms get worse or if you keep having pain and swelling. This information is not intended to replace advice given to you by your health care provider. Make sure you discuss any questions you have with your health care provider. Document Revised: 04/04/2017 Document Reviewed: 04/04/2017 Elsevier Patient Education  2020 [employer].

## 2020-01-05 NOTE — Progress Notes (Signed)
[facility]   Established Patient Office Visit  Subjective:  Patient ID: Omar Rogers, male    DOB: 03/09/1951  Age: 69 y.o. MRN: [id number]  CC:  Chief Complaint  Patient presents with  . Follow-up    6 months FU; medication refills    Omar Rogers is a 69 year male who presents for Follow Up today.   Past Medical History:  Diagnosis Date  . Bipolar disorder (HCC)   . DVT (deep venous thrombosis) (HCC) 06/29/2013   extensive left iliofemoral popliteal DVT/notes (06/29/2013)  . Hypothyroidism 02/2019  . Increased thyroid stimulating hormone (TSH) level 12/2018  . 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 Vitamin B 12 deficiency 12/2018  . Vitamin D deficiency 12/2018     Patient Active Problem List   Diagnosis Date Noted  . Hyperglycemia 07/07/2019  . Dyslipidemia 01/06/2014  . Elevated transaminase level 09/20/2013  . IFG (impaired fasting glucose) 09/20/2013  . Essential hypertension, benign 09/20/2013  . 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 DVT (deep vein thrombosis) in pregnancy 06/29/2013  . DVT (deep venous thrombosis) (HCC) 06/29/2013  . Bipolar disorder, unspecified (HCC) 06/29/2013  . Leukocytosis, unspecified 06/29/2013  . Hyponatremia 06/29/2013    Current Status: Since his last office visit, he has c/o moderate intermittent right arm pain X 1 month now. He denies trauma to area. He has taken OTC pain med for pain relief. He denies fevers, chills, fatigue, recent infections, weight loss, and night sweats. He has not had any headaches, visual changes, dizziness, and falls. No chest pain, heart palpitations, cough and shortness of breath reported. Denies GI problems such as nausea, vomiting, diarrhea, and constipation. He has no reports of blood in stools, dysuria and hematuria. No depression or anxiety, and denies suicidal ideations, homicidal ideations, or auditory hallucinations. He is taking all  medications as prescribed.   Past Surgical History:  Procedure Laterality Date  . ANKLE FRACTURE SURGERY Right ~ 2012    Family History  Problem Relation Age of Onset  . Hypertension Mother   . Cancer Mother   . Hypertension Father     Social History   Socioeconomic History  . Marital status: Single    Spouse name: Not on file  . Number of children: Not on file  . Years of education: Not on file  . Highest education level: Not on file  Occupational History  . Not on file  Tobacco Use  . Smoking status: Former Smoker    Packs/day: 0.50    Years: 5.00    Pack years: 2.50    Types: Cigarettes  . Smokeless tobacco: Never Used  . Tobacco comment: 06/29/2013 "quit smoking ~ 2 yr ago"  Substance and Sexual Activity  . Alcohol use: Yes    Alcohol/week: 42.0 standard drinks    Types: 42 Cans of beer per week    Comment: occ  . Drug use: No  . Sexual activity: Not Currently  Other Topics Concern  . Not on file  Social History Narrative  . Not on file   Social Determinants of Health   Financial Resource Strain:   . Difficulty of Paying Living Expenses:   Food Insecurity:   . Worried About Programme researcher, broadcasting/film/video in the Last Year:   . Barista in the Last Year:   Transportation Needs:   . Freight forwarder (Medical):   Marland Kitchen Lack of Transportation (Non-Medical):   Physical Activity:   .  Days of Exercise per Week:   . Minutes of Exercise per Session:   Stress:   . Feeling of Stress :   Social Connections:   . Frequency of Communication with Friends and Family:   . Frequency of Social Gatherings with Friends and Family:   . Attends Religious Services:   . Active Member of Clubs or Organizations:   . Attends Banker Meetings:   Marland Kitchen Marital Status:   Intimate Partner Violence:   . Fear of Current or Ex-Partner:   . Emotionally Abused:   Marland Kitchen Physically Abused:   . Sexually Abused:     Outpatient Medications Prior to Visit  Medication Sig Dispense  Refill  . amLODipine (NORVASC) 10 MG tablet TAKE 1 TABLET(10 MG) BY MOUTH DAILY 90 tablet 1  . atorvastatin (LIPITOR) 10 MG tablet TAKE 1 TABLET(10 MG) BY MOUTH DAILY 90 tablet 1  . levothyroxine (SYNTHROID) 137 MCG tablet TAKE 1 TABLET(137 MCG) BY MOUTH DAILY BEFORE BREAKFAST 90 tablet 1  . Multiple Vitamins-Minerals (MULTIVITAMIN WITH MINERALS) tablet Take 1 tablet by mouth daily.    . QUEtiapine (SEROQUEL) 100 MG tablet Take 100 mg by mouth at bedtime.    Marland Kitchen SAPHRIS 10 MG SUBL Place 10 mg under the tongue daily.     No facility-administered medications prior to visit.    Allergies  Allergen Reactions  . Chlorpromazine Other (See Comments)    unknown reaction- reported from previous hospital records  . Haloperidol Other (See Comments)    Unknown reaction-reported via hospital records    ROS Review of Systems  Constitutional: Negative.   HENT: Negative.   Eyes: Negative.   Respiratory: Negative.   Cardiovascular: Negative.   Endocrine: Negative.   Genitourinary: Negative.   Musculoskeletal:       Right arm/shoulder pain  Allergic/Immunologic: Negative.   Neurological: Positive for dizziness (occasional ) and headaches (occasional ).  Hematological: Negative.   Psychiatric/Behavioral: Negative.    Objective:    Physical Exam  Constitutional: He is oriented to person, place, and time. He appears well-developed and well-nourished.  HENT:  Head: Normocephalic and atraumatic.  Eyes: Conjunctivae are normal.  Cardiovascular: Normal rate, regular rhythm, normal heart sounds and intact distal pulses.  Pulmonary/Chest: Effort normal and breath sounds normal.  Abdominal: Soft. Bowel sounds are normal.  Musculoskeletal:     Cervical back: Normal range of motion and neck supple.     Comments: Limited ROM in right shoulder.  Neurological: He is alert and oriented to person, place, and time. He has normal reflexes.  Skin: Skin is warm and dry.  Psychiatric: He has a normal mood  and affect. His behavior is normal. Judgment and thought content normal.  Nursing note and vitals reviewed.   BP 111/73 (BP Location: Right Arm, Patient Position: Sitting, Cuff Size: Large)   Pulse 72   Temp 97.8 F (36.6 C)   Wt 200 lb 0.2 oz (90.7 kg)   SpO2 98%   BMI 28.70 kg/m  Wt Readings from Last 3 Encounters:  01/05/20 200 lb 0.2 oz (90.7 kg)  07/07/19 198 lb (89.8 kg)  01/05/19 188 lb 3.2 oz (85.4 kg)     Health Maintenance Due  Topic Date Due  . COLONOSCOPY  Never done    There are no preventive care reminders to display for this patient.  Lab Results  Component Value Date   TSH 6.790 (H) 03/08/2019   Lab Results  Component Value Date   WBC 6.6 01/05/2019  HGB 16.8 01/05/2019   HCT 47.5 01/05/2019   MCV 99 (H) 01/05/2019   PLT 190 01/05/2019   Lab Results  Component Value Date   NA 138 01/05/2019   K 4.3 01/05/2019   CO2 21 01/05/2019   GLUCOSE 99 01/05/2019   BUN 10 01/05/2019   CREATININE 0.88 01/05/2019   BILITOT 0.7 01/05/2019   ALKPHOS 84 01/05/2019   AST 30 01/05/2019   ALT 26 01/05/2019   PROT 6.6 01/05/2019   ALBUMIN 4.3 01/05/2019   CALCIUM 9.6 01/05/2019   ANIONGAP 7 06/23/2017   Lab Results  Component Value Date   CHOL 247 (H) 01/05/2019   Lab Results  Component Value Date   HDL 48 01/05/2019   Lab Results  Component Value Date   LDLCALC 163 (H) 01/05/2019   Lab Results  Component Value Date   TRIG 179 (H) 01/05/2019   Lab Results  Component Value Date   CHOLHDL 5.1 (H) 01/05/2019   Lab Results  Component Value Date   HGBA1C 5.5 01/05/2020   HGBA1C 5.5 01/05/2020   HGBA1C 5.5 (A) 01/05/2020   HGBA1C 5.5 01/05/2020      Assessment & Plan:   1. Acute pain of right shoulder He will continue OTC pain medications as needed. He will use RICE techniques as needed.   2. Hyperglycemia - HgB A1c - Urinalysis Dipstick - Glucose (CBG)  3. Essential hypertension, benign The current medical regimen is effective;  blood pressure is stable at 111/73 today; continue present plan and medications as prescribed. He will continue to take medications as prescribed, to decrease high sodium intake, excessive alcohol intake, increase potassium intake, smoking cessation, and increase physical activity of at least 30 minutes of cardio activity daily. He will continue to follow Heart Healthy or DASH diet. - Urinalysis Dipstick  4. Bipolar affective disorder, remission status unspecified (HCC)  5. Hypothyroidism, unspecified type Mildly increase TSH. He will continue to take Synthroid as prescribed.   6. Urine white blood cells increased Results are pending. - Urine Culture  7. Health care maintenance - Thyroid Panel With TSH - Lipid Panel - CBC with Differential - Comprehensive metabolic panel; Future - Vitamin D, 25-hydroxy - Vitamin B12  8. Follow up He will follow up in 6 months.   No orders of the defined types were placed in this encounter.  Orders Placed This Encounter  Procedures  . Urine Culture  . Thyroid Panel With TSH  . Lipid Panel  . CBC with Differential  . Comprehensive metabolic panel  . Vitamin D, 25-hydroxy  . Vitamin B12  . HgB A1c  . Urinalysis Dipstick  . Glucose (CBG)    Referral Orders  No referral(s) requested today    Raliegh Ip,  MSN, FNP-BC [facility] [address] [place], [place] [address] [phone] fax    Problem List Items Addressed This Visit      Cardiovascular and Mediastinum   Essential hypertension, benign   Relevant Orders   Urinalysis Dipstick (Completed)     Other   Bipolar disorder, unspecified (HCC)   Hyperglycemia   Relevant Orders   HgB A1c (Completed)   Urinalysis Dipstick (Completed)   Glucose (CBG) (Completed)    Other Visit Diagnoses    Acute pain of right shoulder    -  Primary   Hypothyroidism, unspecified type       Urine white  blood cells increased  Relevant Orders   Urine Culture   Health care maintenance       Relevant Orders   Thyroid Panel With TSH   Lipid Panel   CBC with Differential   Comprehensive metabolic panel   Vitamin D, 25-hydroxy   Vitamin B12   Follow up          No orders of the defined types were placed in this encounter.   Follow-up: Return in about 6 months (around 07/06/2020).    Azzie Glatter, FNP

## 2020-01-06 LAB — CBC WITH DIFFERENTIAL/PLATELET
Basophils Absolute: 0 10*3/uL (ref 0.0–0.2)
Basos: 1 %
EOS (ABSOLUTE): 0.3 10*3/uL (ref 0.0–0.4)
Eos: 5 %
Hematocrit: 46.9 % (ref 37.5–51.0)
Hemoglobin: 16.4 g/dL (ref 13.0–17.7)
Immature Grans (Abs): 0 10*3/uL (ref 0.0–0.1)
Immature Granulocytes: 1 %
Lymphocytes Absolute: 1.3 10*3/uL (ref 0.7–3.1)
Lymphs: 21 %
MCH: 35.1 pg — ABNORMAL HIGH (ref 26.6–33.0)
MCHC: 35 g/dL (ref 31.5–35.7)
MCV: 100 fL — ABNORMAL HIGH (ref 79–97)
Monocytes Absolute: 0.8 10*3/uL (ref 0.1–0.9)
Monocytes: 14 %
Neutrophils Absolute: 3.7 10*3/uL (ref 1.4–7.0)
Neutrophils: 58 %
Platelets: 214 10*3/uL (ref 150–450)
RBC: 4.67 x10E6/uL (ref 4.14–5.80)
RDW: 12.9 % (ref 11.6–15.4)
WBC: 6.2 10*3/uL (ref 3.4–10.8)

## 2020-01-06 LAB — LIPID PANEL
Chol/HDL Ratio: 5.2 ratio — ABNORMAL HIGH (ref 0.0–5.0)
Cholesterol, Total: 173 mg/dL (ref 100–199)
HDL: 33 mg/dL — ABNORMAL LOW (ref >39)
LDL Chol Calc (NIH): 102 mg/dL — ABNORMAL HIGH (ref 0–99)
Triglycerides: 218 mg/dL — ABNORMAL HIGH (ref 0–149)
VLDL Cholesterol Cal: 38 mg/dL (ref 5–40)

## 2020-01-06 LAB — THYROID PANEL WITH TSH
Free Thyroxine Index: 2.9 (ref 1.2–4.9)
T3 Uptake Ratio: 30 % (ref 24–39)
T4, Total: 9.6 ug/dL (ref 4.5–12.0)
TSH: 0.024 u[IU]/mL — ABNORMAL LOW (ref 0.450–4.500)

## 2020-01-06 LAB — VITAMIN B12: Vitamin B-12: 483 pg/mL (ref 232–1245)

## 2020-01-06 LAB — VITAMIN D 25 HYDROXY (VIT D DEFICIENCY, FRACTURES): Vit D, 25-Hydroxy: 18 ng/mL — ABNORMAL LOW (ref 30.0–100.0)

## 2020-01-07 ENCOUNTER — Other Ambulatory Visit: Payer: Self-pay | Admitting: Family Medicine

## 2020-01-07 DIAGNOSIS — E785 Hyperlipidemia, unspecified: Secondary | ICD-10-CM

## 2020-01-07 DIAGNOSIS — E039 Hypothyroidism, unspecified: Secondary | ICD-10-CM

## 2020-01-07 LAB — URINE CULTURE: Organism ID, Bacteria: NO GROWTH

## 2020-01-07 MED ORDER — ATORVASTATIN CALCIUM 20 MG PO TABS: 20.0000 mg | ORAL_TABLET | Freq: Every day | ORAL | 3 refills | Status: DC

## 2020-02-07 ENCOUNTER — Other Ambulatory Visit: Payer: Medicare HMO

## 2020-02-07 ENCOUNTER — Other Ambulatory Visit: Payer: Self-pay

## 2020-02-07 DIAGNOSIS — Z0000 Encounter for general adult medical examination without abnormal findings: Secondary | ICD-10-CM

## 2020-02-07 DIAGNOSIS — E039 Hypothyroidism, unspecified: Secondary | ICD-10-CM

## 2020-02-08 ENCOUNTER — Other Ambulatory Visit: Payer: Self-pay | Admitting: Family Medicine

## 2020-02-08 DIAGNOSIS — E039 Hypothyroidism, unspecified: Secondary | ICD-10-CM

## 2020-02-08 LAB — THYROID PANEL WITH TSH
Free Thyroxine Index: 1.3 (ref 1.2–4.9)
T3 Uptake Ratio: 24 % (ref 24–39)
T4, Total: 5.3 ug/dL (ref 4.5–12.0)
TSH: 11.2 u[IU]/mL — ABNORMAL HIGH (ref 0.450–4.500)

## 2020-02-08 LAB — COMPREHENSIVE METABOLIC PANEL
ALT: 65 IU/L — ABNORMAL HIGH (ref 0–44)
AST: 52 IU/L — ABNORMAL HIGH (ref 0–40)
Albumin/Globulin Ratio: 1.8 (ref 1.2–2.2)
Albumin: 4.3 g/dL (ref 3.8–4.8)
Alkaline Phosphatase: 103 IU/L (ref 48–121)
BUN/Creatinine Ratio: 12 (ref 10–24)
BUN: 13 mg/dL (ref 8–27)
Bilirubin Total: 0.7 mg/dL (ref 0.0–1.2)
CO2: 19 mmol/L — ABNORMAL LOW (ref 20–29)
Calcium: 8.9 mg/dL (ref 8.6–10.2)
Chloride: 105 mmol/L (ref 96–106)
Creatinine, Ser: 1.07 mg/dL (ref 0.76–1.27)
GFR calc Af Amer: 83 mL/min/{1.73_m2} (ref >59)
GFR calc non Af Amer: 72 mL/min/{1.73_m2} (ref >59)
Globulin, Total: 2.4 g/dL (ref 1.5–4.5)
Glucose: 107 mg/dL — ABNORMAL HIGH (ref 65–99)
Potassium: 3.9 mmol/L (ref 3.5–5.2)
Sodium: 138 mmol/L (ref 134–144)
Total Protein: 6.7 g/dL (ref 6.0–8.5)

## 2020-03-04 ENCOUNTER — Other Ambulatory Visit: Payer: Self-pay | Admitting: Family Medicine

## 2020-03-04 DIAGNOSIS — I10 Essential (primary) hypertension: Secondary | ICD-10-CM

## 2020-03-08 ENCOUNTER — Other Ambulatory Visit: Payer: Self-pay | Admitting: Family Medicine

## 2020-03-08 DIAGNOSIS — E785 Hyperlipidemia, unspecified: Secondary | ICD-10-CM

## 2020-03-09 ENCOUNTER — Other Ambulatory Visit: Payer: Self-pay

## 2020-03-09 DIAGNOSIS — E785 Hyperlipidemia, unspecified: Secondary | ICD-10-CM

## 2020-03-09 MED ORDER — ATORVASTATIN CALCIUM 20 MG PO TABS: 20.0000 mg | ORAL_TABLET | Freq: Every day | ORAL | 3 refills | Status: DC

## 2020-03-13 DIAGNOSIS — G8929 Other chronic pain: Secondary | ICD-10-CM | POA: Diagnosis not present

## 2020-03-13 DIAGNOSIS — M1990 Unspecified osteoarthritis, unspecified site: Secondary | ICD-10-CM | POA: Diagnosis not present

## 2020-03-13 DIAGNOSIS — R69 Illness, unspecified: Secondary | ICD-10-CM | POA: Diagnosis not present

## 2020-03-13 DIAGNOSIS — E785 Hyperlipidemia, unspecified: Secondary | ICD-10-CM | POA: Diagnosis not present

## 2020-03-13 DIAGNOSIS — I10 Essential (primary) hypertension: Secondary | ICD-10-CM | POA: Diagnosis not present

## 2020-03-17 DIAGNOSIS — B078 Other viral warts: Secondary | ICD-10-CM | POA: Diagnosis not present

## 2020-03-17 DIAGNOSIS — L820 Inflamed seborrheic keratosis: Secondary | ICD-10-CM | POA: Diagnosis not present

## 2020-03-23 DIAGNOSIS — R69 Illness, unspecified: Secondary | ICD-10-CM | POA: Diagnosis not present

## 2020-03-27 DIAGNOSIS — R69 Illness, unspecified: Secondary | ICD-10-CM | POA: Diagnosis not present

## 2020-06-08 DIAGNOSIS — R69 Illness, unspecified: Secondary | ICD-10-CM | POA: Diagnosis not present

## 2020-07-07 ENCOUNTER — Encounter: Payer: Self-pay | Admitting: Family Medicine

## 2020-07-07 ENCOUNTER — Ambulatory Visit (INDEPENDENT_AMBULATORY_CARE_PROVIDER_SITE_OTHER): Payer: Medicare HMO | Admitting: Family Medicine

## 2020-07-07 ENCOUNTER — Other Ambulatory Visit: Payer: Self-pay

## 2020-07-07 VITALS — BP 123/78 | HR 79 | Temp 97.6°F | Ht 71.0 in | Wt 203.0 lb

## 2020-07-07 DIAGNOSIS — Z9119 Patient's noncompliance with other medical treatment and regimen: Secondary | ICD-10-CM | POA: Diagnosis not present

## 2020-07-07 DIAGNOSIS — F319 Bipolar disorder, unspecified: Secondary | ICD-10-CM

## 2020-07-07 DIAGNOSIS — Z09 Encounter for follow-up examination after completed treatment for conditions other than malignant neoplasm: Secondary | ICD-10-CM | POA: Diagnosis not present

## 2020-07-07 DIAGNOSIS — E039 Hypothyroidism, unspecified: Secondary | ICD-10-CM

## 2020-07-07 DIAGNOSIS — I10 Essential (primary) hypertension: Secondary | ICD-10-CM

## 2020-07-07 DIAGNOSIS — Z91199 Patient's noncompliance with other medical treatment and regimen due to unspecified reason: Secondary | ICD-10-CM

## 2020-07-07 DIAGNOSIS — R69 Illness, unspecified: Secondary | ICD-10-CM | POA: Diagnosis not present

## 2020-07-07 NOTE — Progress Notes (Signed)
[facility]    Established Patient Office Visit  Subjective:  Patient ID: Omar Rogers, male    DOB: 19-Jun-1951  Age: 69 y.o. MRN: [id number]  CC:  Chief Complaint  Patient presents with  . Follow-up    Pt. Is here for HTN follow up.     HPI Omar Rogers is a 68 year old male who presents for follow    Patient Active Problem List   Diagnosis Date Noted  . Hyperglycemia 07/07/2019  . Dyslipidemia 01/06/2014  . Elevated transaminase level 09/20/2013  . IFG (impaired fasting glucose) 09/20/2013  . Essential hypertension, benign 09/20/2013  . 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 DVT (deep vein thrombosis) in pregnancy 06/29/2013  . DVT (deep venous thrombosis) (HCC) 06/29/2013  . Bipolar disorder, unspecified (HCC) 06/29/2013  . Leukocytosis, unspecified 06/29/2013  . Hyponatremia 06/29/2013     Current Status: Since his last office visit, e\he is doing well with no complaints. He has has history of thyroid issues, but has consistently declined treatment. He denies fevers, chills, fatigue, recent infections, weight loss, and night sweats. He has not had any headaches, visual changes, dizziness, and falls. No chest pain, heart palpitations, cough and shortness of breath reported. Denies GI problems such as nausea, vomiting, diarrhea, and constipation. He has no reports of blood in stools, dysuria and hematuria. No depression or anxiety reported today. He is taking all medications as prescribed. He denies pain today.   Past Medical History:  Diagnosis Date  . Bipolar disorder (HCC)   . DVT (deep venous thrombosis) (HCC) 06/29/2013   extensive left iliofemoral popliteal DVT/notes (06/29/2013)  . Hypothyroidism 02/2019  . Increased thyroid stimulating hormone (TSH) level 12/2018  . 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 Vitamin B 12 deficiency 12/2018  . Vitamin D deficiency 12/2018    Past Surgical History:  Procedure  Laterality Date  . ANKLE FRACTURE SURGERY Right ~ 2012    Family History  Problem Relation Age of Onset  . Hypertension Mother   . Cancer Mother   . Hypertension Father     Social History   Socioeconomic History  . Marital status: Single    Spouse name: Not on file  . Number of children: Not on file  . Years of education: Not on file  . Highest education level: Not on file  Occupational History  . Not on file  Tobacco Use  . Smoking status: Former Smoker    Packs/day: 0.50    Years: 5.00    Pack years: 2.50    Types: Cigarettes  . Smokeless tobacco: Never Used  . Tobacco comment: 06/29/2013 "quit smoking ~ 2 yr ago"  Vaping Use  . Vaping Use: Never used  Substance and Sexual Activity  . Alcohol use: Yes    Alcohol/week: 42.0 standard drinks    Types: 42 Cans of beer per week    Comment: occ  . Drug use: No  . Sexual activity: Not Currently  Other Topics Concern  . Not on file  Social History Narrative  . Not on file   Social Determinants of Health   Financial Resource Strain: Not on file  Food Insecurity: Not on file  Transportation Needs: Not on file  Physical Activity: Not on file  Stress: Not on file  Social Connections: Not on file  Intimate Partner Violence: Not on file    Outpatient Medications Prior to Visit  Medication Sig Dispense Refill  . amLODipine (  NORVASC) 10 MG tablet TAKE 1 TABLET(10 MG) BY MOUTH DAILY 90 tablet 1  . atorvastatin (LIPITOR) 20 MG tablet Take 1 tablet (20 mg total) by mouth daily. 90 tablet 3  . Multiple Vitamins-Minerals (MULTIVITAMIN WITH MINERALS) tablet Take 1 tablet by mouth daily.    . QUEtiapine (SEROQUEL) 100 MG tablet Take 100 mg by mouth at bedtime.    Marland Kitchen levothyroxine (SYNTHROID) 137 MCG tablet TAKE 1 TABLET(137 MCG) BY MOUTH DAILY BEFORE BREAKFAST (Patient not taking: Reported on 07/07/2020) 90 tablet 1   No facility-administered medications prior to visit.    Allergies  Allergen Reactions  . Chlorpromazine  Other (See Comments)    unknown reaction- reported from previous hospital records  . Haloperidol Other (See Comments)    Unknown reaction-reported via hospital records    ROS Review of Systems  Constitutional: Negative.   HENT: Negative.   Eyes: Negative.   Respiratory: Negative.   Cardiovascular: Negative.   Gastrointestinal: Negative.   Endocrine: Negative.   Genitourinary: Negative.   Musculoskeletal: Negative.   Skin: Negative.   Allergic/Immunologic: Negative.   Neurological: Positive for dizziness (occasional ) and headaches (occasional ).  Hematological: Negative.   Psychiatric/Behavioral: Negative.       Objective:    Physical Exam Vitals and nursing note reviewed.  Constitutional:      Appearance: Normal appearance.  HENT:     Head: Normocephalic and atraumatic.     Nose: Nose normal.     Mouth/Throat:     Mouth: Mucous membranes are moist.     Pharynx: Oropharynx is clear.  Cardiovascular:     Rate and Rhythm: Normal rate and regular rhythm.     Pulses: Normal pulses.     Heart sounds: Normal heart sounds.  Pulmonary:     Effort: Pulmonary effort is normal.     Breath sounds: Normal breath sounds.  Abdominal:     General: Bowel sounds are normal.     Palpations: Abdomen is soft.  Musculoskeletal:        General: Normal range of motion.     Cervical back: Normal range of motion and neck supple.  Skin:    General: Skin is warm and dry.  Neurological:     General: No focal deficit present.     Mental Status: He is alert and oriented to person, place, and time.  Psychiatric:        Mood and Affect: Mood normal.        Behavior: Behavior normal.        Judgment: Judgment normal.     BP 123/78 (BP Location: Right Arm, Patient Position: Sitting, Cuff Size: Normal)   Pulse 79   Temp 97.6 F (36.4 C) (Temporal)   Ht 5\' 11"  (1.803 m)   Wt 203 lb (92.1 kg)   SpO2 95%   BMI 28.31 kg/m  Wt Readings from Last 3 Encounters:  07/07/20 203 lb (92.1 kg)   01/05/20 200 lb 0.2 oz (90.7 kg)  07/07/19 198 lb (89.8 kg)     Health Maintenance Due  Topic Date Due  . Hepatitis C Screening  Never done  . COLONOSCOPY  Never done    There are no preventive care reminders to display for this patient.  Lab Results  Component Value Date   TSH 17.700 (H) 07/07/2020   Lab Results  Component Value Date   WBC 6.2 01/05/2020   HGB 16.4 01/05/2020   HCT 46.9 01/05/2020   MCV 100 (H) 01/05/2020  PLT 214 01/05/2020   Lab Results  Component Value Date   NA 138 02/07/2020   K 3.9 02/07/2020   CO2 19 (L) 02/07/2020   GLUCOSE 107 (H) 02/07/2020   BUN 13 02/07/2020   CREATININE 1.07 02/07/2020   BILITOT 0.7 02/07/2020   ALKPHOS 103 02/07/2020   AST 52 (H) 02/07/2020   ALT 65 (H) 02/07/2020   PROT 6.7 02/07/2020   ALBUMIN 4.3 02/07/2020   CALCIUM 8.9 02/07/2020   ANIONGAP 7 06/23/2017   Lab Results  Component Value Date   CHOL 173 01/05/2020   Lab Results  Component Value Date   HDL 33 (L) 01/05/2020   Lab Results  Component Value Date   LDLCALC 102 (H) 01/05/2020   Lab Results  Component Value Date   TRIG 218 (H) 01/05/2020   Lab Results  Component Value Date   CHOLHDL 5.2 (H) 01/05/2020   Lab Results  Component Value Date   HGBA1C 5.5 01/05/2020   HGBA1C 5.5 01/05/2020   HGBA1C 5.5 (A) 01/05/2020   HGBA1C 5.5 01/05/2020   Assessment & Plan:   1. Hypothyroidism, unspecified type Patient continues to decline treatment.  - Thyroid Panel With TSH  2. Essential hypertension, benign The current medical regimen is effective; blood pressure is stable at 123/78 today; continue present plan and medications as prescribed. He will continue to take medications as prescribed, to decrease high sodium intake, excessive alcohol intake, increase potassium intake, smoking cessation, and increase physical activity of at least 30 minutes of cardio activity daily. He will continue to follow Heart Healthy or DASH diet.  3. Bipolar  affective disorder, remission status unspecified (HCC) Stable today.   4. History of noncompliance with medical treatment Declines treatment for Thyroid issues.   5. Follow up He will follow up in 12/2020 for Annual Physical and Labs.   No orders of the defined types were placed in this encounter.   Orders Placed This Encounter  Procedures  . Thyroid Panel With TSH    Referral Orders  No referral(s) requested today    Raliegh Ip, MSN, ANE, FNP-BC [facility] [address] [place], [place] [address] [phone] fax  Problem List Items Addressed This Visit      Cardiovascular and Mediastinum   Essential hypertension, benign     Other   Bipolar disorder, unspecified (HCC)    Other Visit Diagnoses    Hypothyroidism, unspecified type    -  Primary   Relevant Orders   Thyroid Panel With TSH (Completed)   History of noncompliance with medical treatment       Follow up          No orders of the defined types were placed in this encounter.   Follow-up: No follow-ups on file.    Kallie Locks, FNP

## 2020-07-08 ENCOUNTER — Encounter: Payer: Self-pay | Admitting: Family Medicine

## 2020-07-08 LAB — THYROID PANEL WITH TSH
Free Thyroxine Index: 1.1 — ABNORMAL LOW (ref 1.2–4.9)
T3 Uptake Ratio: 21 % — ABNORMAL LOW (ref 24–39)
T4, Total: 5.4 ug/dL (ref 4.5–12.0)
TSH: 17.7 u[IU]/mL — ABNORMAL HIGH (ref 0.450–4.500)

## 2020-07-12 ENCOUNTER — Telehealth: Payer: Self-pay | Admitting: Family Medicine

## 2020-07-12 ENCOUNTER — Other Ambulatory Visit: Payer: Self-pay | Admitting: Family Medicine

## 2020-07-12 DIAGNOSIS — E039 Hypothyroidism, unspecified: Secondary | ICD-10-CM

## 2020-07-12 NOTE — Telephone Encounter (Signed)
Spoke with patient in detail r/t TSH results. Patient continues to decline treatment at this time. He will report to office for re-assessment of Thyroid levels in a few weeks.

## 2020-07-12 NOTE — Telephone Encounter (Signed)
Chronic Hypothyroidism. Patient has continued to decline treatment or referral to Endocrinology. Message left on voicemail to contact office to review.

## 2020-07-26 ENCOUNTER — Other Ambulatory Visit: Payer: Medicare HMO

## 2020-07-26 ENCOUNTER — Other Ambulatory Visit: Payer: Self-pay

## 2020-07-26 DIAGNOSIS — E039 Hypothyroidism, unspecified: Secondary | ICD-10-CM | POA: Diagnosis not present

## 2020-07-27 LAB — T4, FREE: Free T4: 1.73 ng/dL (ref 0.82–1.77)

## 2020-07-27 LAB — THYROID PANEL WITH TSH
Free Thyroxine Index: 2.7 (ref 1.2–4.9)
T3 Uptake Ratio: 31 % (ref 24–39)
T4, Total: 8.8 ug/dL (ref 4.5–12.0)
TSH: 2.02 u[IU]/mL (ref 0.450–4.500)

## 2020-07-27 LAB — T3, FREE: T3, Free: 3.3 pg/mL (ref 2.0–4.4)

## 2020-07-28 ENCOUNTER — Telehealth: Payer: Self-pay

## 2020-07-28 ENCOUNTER — Other Ambulatory Visit: Payer: Self-pay | Admitting: Family Medicine

## 2020-07-28 DIAGNOSIS — E039 Hypothyroidism, unspecified: Secondary | ICD-10-CM

## 2020-07-28 MED ORDER — LEVOTHYROXINE SODIUM 137 MCG PO TABS: ORAL_TABLET | ORAL | 3 refills | Status: DC

## 2020-07-28 NOTE — Telephone Encounter (Signed)
Contacted pt to go over lab results. Pt is aware. Pt is requesting a refill on synthroid

## 2020-08-25 DIAGNOSIS — R69 Illness, unspecified: Secondary | ICD-10-CM | POA: Diagnosis not present

## 2020-09-04 ENCOUNTER — Other Ambulatory Visit: Payer: Self-pay | Admitting: Family Medicine

## 2020-09-04 DIAGNOSIS — Z791 Long term (current) use of non-steroidal anti-inflammatories (NSAID): Secondary | ICD-10-CM | POA: Diagnosis not present

## 2020-09-04 DIAGNOSIS — Z008 Encounter for other general examination: Secondary | ICD-10-CM | POA: Diagnosis not present

## 2020-09-04 DIAGNOSIS — Z809 Family history of malignant neoplasm, unspecified: Secondary | ICD-10-CM | POA: Diagnosis not present

## 2020-09-04 DIAGNOSIS — I10 Essential (primary) hypertension: Secondary | ICD-10-CM

## 2020-09-04 DIAGNOSIS — E039 Hypothyroidism, unspecified: Secondary | ICD-10-CM | POA: Diagnosis not present

## 2020-09-04 DIAGNOSIS — E785 Hyperlipidemia, unspecified: Secondary | ICD-10-CM | POA: Diagnosis not present

## 2020-09-04 DIAGNOSIS — Z87891 Personal history of nicotine dependence: Secondary | ICD-10-CM | POA: Diagnosis not present

## 2020-09-04 DIAGNOSIS — G4700 Insomnia, unspecified: Secondary | ICD-10-CM | POA: Diagnosis not present

## 2020-09-04 DIAGNOSIS — R69 Illness, unspecified: Secondary | ICD-10-CM | POA: Diagnosis not present

## 2020-09-04 DIAGNOSIS — Z8249 Family history of ischemic heart disease and other diseases of the circulatory system: Secondary | ICD-10-CM | POA: Diagnosis not present

## 2020-12-11 DIAGNOSIS — R69 Illness, unspecified: Secondary | ICD-10-CM | POA: Diagnosis not present

## 2021-01-05 ENCOUNTER — Ambulatory Visit: Payer: Medicare HMO | Admitting: Family Medicine

## 2021-01-05 ENCOUNTER — Ambulatory Visit (INDEPENDENT_AMBULATORY_CARE_PROVIDER_SITE_OTHER): Payer: Medicare HMO | Admitting: Nurse Practitioner

## 2021-01-05 ENCOUNTER — Encounter: Payer: Self-pay | Admitting: Nurse Practitioner

## 2021-01-05 ENCOUNTER — Other Ambulatory Visit: Payer: Self-pay

## 2021-01-05 VITALS — BP 122/76 | HR 70 | Temp 97.8°F | Resp 18 | Ht 70.0 in | Wt 184.6 lb

## 2021-01-05 DIAGNOSIS — E785 Hyperlipidemia, unspecified: Secondary | ICD-10-CM | POA: Diagnosis not present

## 2021-01-05 DIAGNOSIS — I10 Essential (primary) hypertension: Secondary | ICD-10-CM | POA: Diagnosis not present

## 2021-01-05 DIAGNOSIS — Z130 Encounter for screening for diseases of the blood and blood-forming organs and certain disorders involving the immune mechanism: Secondary | ICD-10-CM | POA: Diagnosis not present

## 2021-01-05 DIAGNOSIS — E039 Hypothyroidism, unspecified: Secondary | ICD-10-CM | POA: Diagnosis not present

## 2021-01-05 DIAGNOSIS — Z131 Encounter for screening for diabetes mellitus: Secondary | ICD-10-CM

## 2021-01-05 LAB — POCT GLYCOSYLATED HEMOGLOBIN (HGB A1C): Hemoglobin A1C: 5.3 % (ref 4.0–5.6)

## 2021-01-05 LAB — POCT URINALYSIS DIPSTICK
Blood, UA: NEGATIVE
Glucose, UA: NEGATIVE
Leukocytes, UA: NEGATIVE
Nitrite, UA: NEGATIVE
Protein, UA: POSITIVE — AB
Spec Grav, UA: >=1.03 — AB (ref 1.010–1.025)
Urobilinogen, UA: 0.2 E.U./dL
pH, UA: 5.5 (ref 5.0–8.0)

## 2021-01-05 NOTE — Patient Instructions (Addendum)
Managing Your Hypertension Hypertension, also called high blood pressure, is when the force of the blood pressing against the walls of the arteries is too strong. Arteries are blood vessels that carry blood from your heart throughout your body. Hypertension forces the heart to work harder to pump blood and may cause the arteries tobecome narrow or stiff. Understanding blood pressure readings Your personal target blood pressure may vary depending on your medical conditions, your age, and other factors. A blood pressure reading includes a higher number over a lower number. Ideally, your blood pressure should be below 120/80. You should know that: The first, or top, number is called the systolic pressure. It is a measure of the pressure in your arteries as your heart beats. The second, or bottom number, is called the diastolic pressure. It is a measure of the pressure in your arteries as the heart relaxes. Blood pressure is classified into four stages. Based on your blood pressure reading, your health care provider may use the following stages to determine what type of treatment you need, if any. Systolic pressure and diastolicpressure are measured in a unit called mmHg. Normal Systolic pressure: below 120. Diastolic pressure: below 80. Elevated Systolic pressure: 120-129. Diastolic pressure: below 80. Hypertension stage 1 Systolic pressure: 130-139. Diastolic pressure: 80-89. Hypertension stage 2 Systolic pressure: 140 or above. Diastolic pressure: 90 or above. How can this condition affect me? Managing your hypertension is an important responsibility. Over time, hypertension can damage the arteries and decrease blood flow to important parts of the body, including the brain, heart, and kidneys. Having untreated or uncontrolled hypertension can lead to: A heart attack. A stroke. A weakened blood vessel (aneurysm). Heart failure. Kidney damage. Eye damage. Metabolic syndrome. Memory and  concentration problems. Vascular dementia. What actions can I take to manage this condition? Hypertension can be managed by making lifestyle changes and possibly by taking medicines. Your health care provider will help you make a plan to bring yourblood pressure within a normal range. Nutrition  Eat a diet that is high in fiber and potassium, and low in salt (sodium), added sugar, and fat. An example eating plan is called the Dietary Approaches to Stop Hypertension (DASH) diet. To eat this way: Eat plenty of fresh fruits and vegetables. Try to fill one-half of your plate at each meal with fruits and vegetables. Eat whole grains, such as whole-wheat pasta, brown rice, or whole-grain bread. Fill about one-fourth of your plate with whole grains. Eat low-fat dairy products. Avoid fatty cuts of meat, processed or cured meats, and poultry with skin. Fill about one-fourth of your plate with lean proteins such as fish, chicken without skin, beans, eggs, and tofu. Avoid pre-made and processed foods. These tend to be higher in sodium, added sugar, and fat. Reduce your daily sodium intake. Most people with hypertension should eat less than 1,500 mg of sodium a day.  Lifestyle  Work with your health care provider to maintain a healthy body weight or to lose weight. Ask what an ideal weight is for you. Get at least 30 minutes of exercise that causes your heart to beat faster (aerobic exercise) most days of the week. Activities may include walking, swimming, or biking. Include exercise to strengthen your muscles (resistance exercise), such as weight lifting, as part of your weekly exercise routine. Try to do these types of exercises for 30 minutes at least 3 days a week. Do not use any products that contain nicotine or tobacco, such as cigarettes, e-cigarettes, and chewing   tobacco. If you need help quitting, ask your health care provider. Control any long-term (chronic) conditions you have, such as high  cholesterol or diabetes. Identify your sources of stress and find ways to manage stress. This may include meditation, deep breathing, or making time for fun activities.  Alcohol use Do not drink alcohol if: Your health care provider tells you not to drink. You are pregnant, may be pregnant, or are planning to become pregnant. If you drink alcohol: Limit how much you use to: 0-1 drink a day for women. 0-2 drinks a day for men. Be aware of how much alcohol is in your drink. In the [place] one drink equals one 12 oz bottle of beer (355 mL), one 5 oz glass of wine (148 mL), or one 1 oz glass of hard liquor (44 mL). Medicines Your health care provider may prescribe medicine if lifestyle changes are not enough to get your blood pressure under control and if: Your systolic blood pressure is 130 or higher. Your diastolic blood pressure is 80 or higher. Take medicines only as told by your health care provider. Follow the directions carefully. Blood pressure medicines must be taken as told by your health care provider. The medicine does not work as well when you skip doses. Skippingdoses also puts you at risk for problems. Monitoring Before you monitor your blood pressure: Do not smoke, drink caffeinated beverages, or exercise within 30 minutes before taking a measurement. Use the bathroom and empty your bladder (urinate). Sit quietly for at least 5 minutes before taking measurements. Monitor your blood pressure at home as told by your health care provider. To do this: Sit with your back straight and supported. Place your feet flat on the floor. Do not cross your legs. Support your arm on a flat surface, such as a table. Make sure your upper arm is at heart level. Each time you measure, take two or three readings one minute apart and record the results. You may also need to have your blood pressure checked regularly by your healthcare provider. General information Talk with your health care  provider about your diet, exercise habits, and other lifestyle factors that may be contributing to hypertension. Review all the medicines you take with your health care provider because there may be side effects or interactions. Keep all visits as told by your health care provider. Your health care provider can help you create and adjust your plan for managing your high blood pressure. Where to find more information National Heart, Lung, and Blood Institute: www.nhlbi.nih.gov American Heart Association: www.heart.org Contact a health care provider if: You think you are having a reaction to medicines you have taken. You have repeated (recurrent) headaches. You feel dizzy. You have swelling in your ankles. You have trouble with your vision. Get help right away if: You develop a severe headache or confusion. You have unusual weakness or numbness, or you feel faint. You have severe pain in your chest or abdomen. You vomit repeatedly. You have trouble breathing. These symptoms may represent a serious problem that is an emergency. Do not wait to see if the symptoms will go away. Get medical help right away. Call your local emergency services (911 in the [place] Do not drive yourself to the hospital. Summary Hypertension is when the force of blood pumping through your arteries is too strong. If this condition is not controlled, it may put you at risk for serious complications. Your personal target blood pressure may vary depending on your medical conditions,   your age, and other factors. For most people, a normal blood pressure is less than 120/80. Hypertension is managed by lifestyle changes, medicines, or both. Lifestyle changes to help manage hypertension include losing weight, eating a healthy, low-sodium diet, exercising more, stopping smoking, and limiting alcohol. This information is not intended to replace advice given to you by your health care provider. Make sure you discuss any questions  you have with your healthcare provider. Document Revised: 08/20/2019 Document Reviewed: 06/15/2019 Elsevier Patient Education  2022 [employer]  Hypothyroidism  Hypothyroidism is when the thyroid gland does not make enough of certain hormones (it is underactive). The thyroid gland is a small gland located in the lower front part of the neck, just in front of the windpipe (trachea). This gland makes hormones that help control how the body uses food for energy (metabolism) as well as how the heart and brain function. These hormones also play a role in keeping your bones strong. When the thyroid is underactive, it produces too little of the hormones thyroxine (T4) and triiodothyronine (T3). What are the causes? This condition may be caused by: Hashimoto's disease. This is a disease in which the body's disease-fighting system (immune system) attacks the thyroid gland. This is the most common cause. Viral infections. Pregnancy. Certain medicines. Birth defects. Past radiation treatments to the head or neck for cancer. Past treatment with radioactive iodine. Past exposure to radiation in the environment. Past surgical removal of part or all of the thyroid. Problems with a gland in the center of the brain (pituitary gland). Lack of enough iodine in the diet. What increases the risk? You are more likely to develop this condition if: You are male. You have a family history of thyroid conditions. You use a medicine called lithium. You take medicines that affect the immune system (immunosuppressants). What are the signs or symptoms? Symptoms of this condition include: Feeling as though you have no energy (lethargy). Not being able to tolerate cold. Weight gain that is not explained by a change in diet or exercise habits. Lack of appetite. Dry skin. Coarse hair. Menstrual irregularity. Slowing of thought processes. Constipation. Sadness or depression. How is this diagnosed? This  condition may be diagnosed based on: Your symptoms, your medical history, and a physical exam. Blood tests. You may also have imaging tests, such as an ultrasound or MRI. How is this treated? This condition is treated with medicine that replaces the thyroid hormones that your body does not make. After you begin treatment, it may take several weeksfor symptoms to go away. Follow these instructions at home: Take over-the-counter and prescription medicines only as told by your health care provider. If you start taking any new medicines, tell your health care provider. Keep all follow-up visits as told by your health care provider. This is important. As your condition improves, your dosage of thyroid hormone medicine may change. You will need to have blood tests regularly so that your health care provider can monitor your condition. Contact a health care provider if: Your symptoms do not get better with treatment. You are taking thyroid hormone replacement medicine and you: Sweat a lot. Have tremors. Feel anxious. Lose weight rapidly. Cannot tolerate heat. Have emotional swings. Have diarrhea. Feel weak. Get help right away if you have: Chest pain. An irregular heartbeat. A rapid heartbeat. Difficulty breathing. Summary Hypothyroidism is when the thyroid gland does not make enough of certain hormones (it is underactive). When the thyroid is underactive, it produces too little of the  hormones thyroxine (T4) and triiodothyronine (T3). The most common cause is Hashimoto's disease, a disease in which the body's disease-fighting system (immune system) attacks the thyroid gland. The condition can also be caused by viral infections, medicine, pregnancy, or past radiation treatment to the head or neck. Symptoms may include weight gain, dry skin, constipation, feeling as though you do not have energy, and not being able to tolerate cold. This condition is treated with medicine to replace the  thyroid hormones that your body does not make. This information is not intended to replace advice given to you by your health care provider. Make sure you discuss any questions you have with your healthcare provider. Document Revised: 04/14/2020 Document Reviewed: 03/30/2020 Elsevier Patient Education  2022 [employer].

## 2021-01-05 NOTE — Progress Notes (Signed)
[facility] Patient [facility] [address] [place], [place]  [address] Phone:  [phone]   Fax:  [phone]   Established Patient Office Visit  Subjective:  Patient ID: Omar Rogers, male    DOB: 1950/12/24  Age: 70 y.o. MRN: [id number]  CC:  Chief Complaint  Patient presents with   Hypertension   Hypothyroidism    HPI Omar Rogers presents for follow up. A former patient of NP Stroud. 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)   Hypothyroidism 02/2019   Increased thyroid stimulating hormone (TSH) level 12/2018   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   Vitamin B 12 deficiency 12/2018   Vitamin D deficiency 12/2018    Past Surgical History:  Procedure Laterality Date   ANKLE FRACTURE SURGERY Right ~ 2012    Family History  Problem Relation Age of Onset   Hypertension Mother    Cancer Mother    Hypertension Father     Social History   Socioeconomic History   Marital status: Single    Spouse name: Not on file   Number of children: Not on file   Years of education: Not on file   Highest education level: Not on file  Occupational History   Not on file  Tobacco Use   Smoking status: Former    Packs/day: 0.50    Years: 5.00    Pack years: 2.50    Types: Cigarettes   Smokeless tobacco: Never   Tobacco comments:    06/29/2013 "quit smoking ~ 2 yr ago"  Vaping Use   Vaping Use: Never used  Substance and Sexual Activity   Alcohol use: Yes    Alcohol/week: 42.0  standard drinks    Types: 42 Cans of beer per week    Comment: occ   Drug use: No   Sexual activity: Not Currently  Other Topics Concern   Not on file  Social History Narrative   Not on file   Social Determinants of Health   Financial Resource Strain: Not on file  Food Insecurity: Not on file  Transportation Needs: Not on file  Physical Activity: Not on file  Stress: Not on file  Social Connections: Not on file  Intimate Partner Violence: Not on file    Outpatient Medications Prior to Visit  Medication Sig Dispense Refill   amLODipine (NORVASC) 10 MG tablet TAKE 1 TABLET(10 MG) BY MOUTH DAILY 90 tablet 1   atorvastatin (LIPITOR) 20 MG tablet Take 1 tablet (20 mg total) by mouth daily. 90 tablet 3   levothyroxine (SYNTHROID) 137 MCG tablet TAKE 1 TABLET(137 MCG) BY MOUTH DAILY BEFORE BREAKFAST 90 tablet 3   Multiple Vitamins-Minerals (MULTIVITAMIN WITH MINERALS) tablet Take 1 tablet by mouth daily.     QUEtiapine (SEROQUEL) 100 MG tablet Take 100 mg by mouth at bedtime.     No facility-administered medications prior to visit.    Allergies  Allergen Reactions   Chlorpromazine Other (See Comments)    unknown reaction- reported from previous hospital records   Haloperidol Other (See Comments)  Unknown reaction-reported via hospital records    ROS Review of Systems    Objective:    Physical Exam Constitutional:      General: He is not in acute distress.    Appearance: He is normal weight. He is not ill-appearing, toxic-appearing or diaphoretic.  HENT:     Head: Normocephalic and atraumatic.     Nose: Nose normal.  Cardiovascular:     Rate and Rhythm: Normal rate and regular rhythm.     Pulses: Normal pulses.     Heart sounds: Normal heart sounds.  Pulmonary:     Effort: Pulmonary effort is normal.     Breath sounds: Normal breath sounds.  Abdominal:     Palpations: Abdomen is soft.  Musculoskeletal:        General: Normal range of motion.     Cervical  back: Normal range of motion.  Skin:    General: Skin is warm and dry.     Capillary Refill: Capillary refill takes less than 2 seconds.  Neurological:     General: No focal deficit present.     Mental Status: He is alert and oriented to person, place, and time.    BP 122/76 (BP Location: Right Arm, Patient Position: Sitting, Cuff Size: Normal)   Pulse 70   Temp 97.8 F (36.6 C)   Resp 18   Ht 5\' 10"  (1.778 m)   Wt 184 lb 9.6 oz (83.7 kg)   SpO2 99%   BMI 26.49 kg/m  Wt Readings from Last 3 Encounters:  01/05/21 184 lb 9.6 oz (83.7 kg)  07/07/20 203 lb (92.1 kg)  01/05/20 200 lb 0.2 oz (90.7 kg)     There are no preventive care reminders to display for this patient.   There are no preventive care reminders to display for this patient.    Lab Results  Component Value Date   NA 138 02/07/2020   K 3.9 02/07/2020   CO2 19 (L) 02/07/2020   GLUCOSE 107 (H) 02/07/2020   BUN 13 02/07/2020   CREATININE 1.07 02/07/2020   BILITOT 0.7 02/07/2020   ALKPHOS 103 02/07/2020   AST 52 (H) 02/07/2020   ALT 65 (H) 02/07/2020   PROT 6.7 02/07/2020   ALBUMIN 4.3 02/07/2020   CALCIUM 8.9 02/07/2020   ANIONGAP 7 06/23/2017   Lab Results  Component Value Date   CHOL 173 01/05/2020   Lab Results  Component Value Date   HDL 33 (L) 01/05/2020   Lab Results  Component Value Date   LDLCALC 102 (H) 01/05/2020   Lab Results  Component Value Date   TRIG 218 (H) 01/05/2020   Lab Results  Component Value Date   CHOLHDL 5.2 (H) 01/05/2020   Lab Results  Component Value Date   HGBA1C 5.3 01/05/2021      Assessment & Plan:   Problem List Items Addressed This Visit       Cardiovascular and Mediastinum   Essential hypertension, benign - Primary Encouraged on going compliance with current medication regimen Encouraged home monitoring and recording BP <130/80 Eating a heart-healthy diet with less salt Encouraged regular physical activity  Recommend Weight loss      Relevant Orders   Comp. Metabolic Panel (12) (Completed)   POCT urinalysis dipstick (Completed)   Other Visit Diagnoses     Hypothyroidism, unspecified type     Stable we will continue with current regimen labs pending   Relevant Orders   TSH (Completed)   Hyperlipidemia, unspecified hyperlipidemia type  Persistent we will continue with current treatment labs are pending   Relevant Orders   Lipid panel (Completed)   Screening for iron deficiency anemia       Relevant Orders   CBC with Differential/Platelet (Completed)   Screening for diabetes mellitus (DM)       Relevant Orders   POCT glycosylated hemoglobin (Hb A1C) (Completed)       No orders of the defined types were placed in this encounter.   Follow-up: Return in about 6 months (around 07/07/2021).    Barbette Merino, NP

## 2021-01-06 LAB — CBC WITH DIFFERENTIAL/PLATELET
Basophils Absolute: 0.1 10*3/uL (ref 0.0–0.2)
Basos: 1 %
EOS (ABSOLUTE): 0.4 10*3/uL (ref 0.0–0.4)
Eos: 4 %
Hematocrit: 48.9 % (ref 37.5–51.0)
Hemoglobin: 17.1 g/dL (ref 13.0–17.7)
Immature Grans (Abs): 0 10*3/uL (ref 0.0–0.1)
Immature Granulocytes: 0 %
Lymphocytes Absolute: 1.6 10*3/uL (ref 0.7–3.1)
Lymphs: 20 %
MCH: 35.3 pg — ABNORMAL HIGH (ref 26.6–33.0)
MCHC: 35 g/dL (ref 31.5–35.7)
MCV: 101 fL — ABNORMAL HIGH (ref 79–97)
Monocytes Absolute: 0.8 10*3/uL (ref 0.1–0.9)
Monocytes: 10 %
Neutrophils Absolute: 5.1 10*3/uL (ref 1.4–7.0)
Neutrophils: 65 %
Platelets: 250 10*3/uL (ref 150–450)
RBC: 4.85 x10E6/uL (ref 4.14–5.80)
RDW: 13.1 % (ref 11.6–15.4)
WBC: 7.9 10*3/uL (ref 3.4–10.8)

## 2021-01-06 LAB — COMP. METABOLIC PANEL (12)
AST: 27 IU/L (ref 0–40)
Albumin/Globulin Ratio: 1.7 (ref 1.2–2.2)
Albumin: 4.4 g/dL (ref 3.8–4.8)
Alkaline Phosphatase: 93 IU/L (ref 44–121)
BUN/Creatinine Ratio: 10 (ref 10–24)
BUN: 11 mg/dL (ref 8–27)
Bilirubin Total: 0.7 mg/dL (ref 0.0–1.2)
Calcium: 9.8 mg/dL (ref 8.6–10.2)
Chloride: 102 mmol/L (ref 96–106)
Creatinine, Ser: 1.06 mg/dL (ref 0.76–1.27)
Globulin, Total: 2.6 g/dL (ref 1.5–4.5)
Glucose: 87 mg/dL (ref 65–99)
Potassium: 4.5 mmol/L (ref 3.5–5.2)
Sodium: 141 mmol/L (ref 134–144)
Total Protein: 7 g/dL (ref 6.0–8.5)
eGFR: 77 mL/min/{1.73_m2} (ref >59)

## 2021-01-06 LAB — LIPID PANEL
Chol/HDL Ratio: 3.5 ratio (ref 0.0–5.0)
Cholesterol, Total: 148 mg/dL (ref 100–199)
HDL: 42 mg/dL (ref >39)
LDL Chol Calc (NIH): 79 mg/dL (ref 0–99)
Triglycerides: 155 mg/dL — ABNORMAL HIGH (ref 0–149)
VLDL Cholesterol Cal: 27 mg/dL (ref 5–40)

## 2021-01-06 LAB — TSH: TSH: 0.282 u[IU]/mL — ABNORMAL LOW (ref 0.450–4.500)

## 2021-01-09 ENCOUNTER — Other Ambulatory Visit: Payer: Self-pay

## 2021-01-09 ENCOUNTER — Ambulatory Visit (INDEPENDENT_AMBULATORY_CARE_PROVIDER_SITE_OTHER): Payer: Medicare HMO | Admitting: Nurse Practitioner

## 2021-01-09 VITALS — BP 113/79 | HR 86 | Temp 97.8°F | Resp 18 | Ht 70.0 in | Wt 189.0 lb

## 2021-01-09 DIAGNOSIS — Z0000 Encounter for general adult medical examination without abnormal findings: Secondary | ICD-10-CM

## 2021-01-09 NOTE — Progress Notes (Signed)
Subjective:   Omar Rogers is a 70 y.o. male who presents for Medicare Annual/Subsequent preventive examination.  Review of Systems    Review of Systems  Constitutional: Negative.   HENT: Negative.    Eyes: Negative.   Respiratory: Negative.    Cardiovascular: Negative.   Gastrointestinal: Negative.   Genitourinary: Negative.   Musculoskeletal: Negative.   Skin: Negative.   Neurological: Negative.   Endo/Heme/Allergies: Negative.   Psychiatric/Behavioral: Negative.     Cardiac Risk Factors include: advanced age (>29men, >19 women);hypertension;sedentary lifestyle;male gender     Objective:    Today's Vitals   01/09/21 0901 01/09/21 0902  BP: 113/79   Pulse: 86   Resp: 18   Temp: 97.8 F (36.6 C)   SpO2: 97%   Weight: 189 lb (85.7 kg)   Height: 5\' 10"  (1.778 m)   PainSc:  0-No pain   Body mass index is 27.12 kg/m.  Advanced Directives 06/23/2017 06/29/2013  Does Patient Have a Medical Advance Directive? No Patient does not have advance directive;Patient would not like information    Current Medications (verified) Outpatient Encounter Medications as of 01/09/2021  Medication Sig   amLODipine (NORVASC) 10 MG tablet TAKE 1 TABLET(10 MG) BY MOUTH DAILY   atorvastatin (LIPITOR) 20 MG tablet Take 1 tablet (20 mg total) by mouth daily.   levothyroxine (SYNTHROID) 137 MCG tablet TAKE 1 TABLET(137 MCG) BY MOUTH DAILY BEFORE BREAKFAST   Multiple Vitamins-Minerals (MULTIVITAMIN WITH MINERALS) tablet Take 1 tablet by mouth daily.   QUEtiapine (SEROQUEL) 100 MG tablet Take 100 mg by mouth at bedtime.   No facility-administered encounter medications on file as of 01/09/2021.    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   Vitamin B 12 deficiency 12/2018   Vitamin D deficiency 12/2018   Past Surgical History:  Procedure Laterality Date   ANKLE FRACTURE SURGERY Right ~ 2012   Family History  Problem Relation Age of Onset   Hypertension Mother    Cancer Mother    Hypertension Father    Social History   Socioeconomic History   Marital status: Single    Spouse name: Not on file   Number of children: Not on file   Years of education: Not on file   Highest education level: Not on file  Occupational History   Not on file  Tobacco Use   Smoking status: Former    Packs/day: 0.50    Years: 5.00    Pack years: 2.50    Types: Cigarettes   Smokeless tobacco: Never   Tobacco comments:    06/29/2013 "quit smoking ~ 2 yr ago"  Vaping Use   Vaping Use: Never used  Substance and Sexual Activity   Alcohol use: Yes    Alcohol/week: 42.0 standard drinks    Types: 42 Cans of beer per week    Comment: occ   Drug use: No   Sexual activity: Not Currently  Other Topics Concern   Not on file  Social History Narrative   Not on file   Social Determinants of Health   Financial Resource Strain: Low Risk    Difficulty of Paying Living Expenses: Not hard at all  Food Insecurity: No Food Insecurity  Worried About Programme researcher, broadcasting/film/video in the Last Year: Never true   [employer] of Food in the Last Year: Never true  Transportation Needs: No Transportation Needs   Lack of Transportation (Medical): No   Lack of Transportation (Non-Medical): No  Physical Activity: Inactive   Days of Exercise per Week: 0 days   Minutes of Exercise per Session: 0 min  Stress: No Stress Concern Present   Feeling of Stress : Not at all  Social Connections: Socially Isolated   Frequency of Communication with Friends and Family: Twice a week   Frequency of Social Gatherings with Friends and Family: Once a week   Attends Religious Services: Never   Loss adjuster, chartered or Organizations: No   Attends Banker Meetings: Never   Marital Status: Divorced    Tobacco Counseling Counseling given: Not Answered Tobacco comments: 06/29/2013 "quit smoking ~ 2 yr ago"   Clinical Intake:  Pre-visit preparation completed: No  Pain : No/denies pain Pain Score: 0-No pain     BMI - recorded: 27.12 Nutritional Status: BMI 25 -29 Overweight Nutritional Risks: None Diabetes: No  How often do you need to have someone help you when you read instructions, pamphlets, or other written materials from your doctor or pharmacy?: 1 - Never What is the last grade level you completed in school?: 12 th  Diabetic?no  Interpreter Needed?: No      Activities of Daily Living In your present state of health, do you have any difficulty performing the following activities: 01/09/2021  Hearing? N  Vision? N  Difficulty concentrating or making decisions? N  Walking or climbing stairs? N  Dressing or bathing? N  Doing errands, shopping? N  Preparing Food and eating ? N  Using the Toilet? N  In the past six months, have you accidently leaked urine? N  Do you have problems with loss of bowel control? N  Managing your Medications? N  Managing your Finances? N  Housekeeping or managing your Housekeeping? N  Some recent data might be hidden    No care team member to display  Indicate any recent Medical Services you may have received from other than [facility] providers in the past year (date may be approximate).     Assessment:   This is a routine wellness examination for Omar Rogers.  Hearing/Vision screen No results found.  Dietary issues and exercise activities discussed: Current Exercise Habits: The patient does not participate in regular exercise at present, Exercise limited by: None identified   Goals Addressed   None    Depression Screen PHQ 2/9 Scores 01/09/2021 07/07/2019 01/05/2019 10/29/2017  PHQ - 2 Score 0 0 0 1  PHQ- 9 Score - - - 2     Fall Risk Fall Risk  01/09/2021 01/05/2020 07/07/2019 01/05/2019 10/29/2017  Falls in the past year? 0 0 0 0 No  Number falls in past yr: 0 0 0 0 -  Injury with Fall? 0 0 0 0 -  Risk for fall due to : No Fall Risks - - - -  Follow up Education provided - - - -    FALL RISK PREVENTION PERTAINING TO THE HOME:  Any stairs in or around the home? No  If so, are there any without handrails? No  Home free of loose throw rugs in walkways, pet beds, electrical cords, etc? Yes  Adequate lighting in your home to reduce risk of falls? Yes   ASSISTIVE DEVICES UTILIZED TO PREVENT FALLS:  Life alert? No  Use of a cane, walker or w/c? No  Grab bars in the bathroom? No  Shower chair or bench in shower? No  Elevated toilet seat or a handicapped toilet? No   TIMED UP AND GO:  Was the test performed? Yes .  Length of time to ambulate 10 feet: 3 sec.   Gait steady and fast without use of assistive device  Cognitive Function: MMSE - Mini Mental State Exam 01/09/2021  Orientation to time 5  Orientation to Place 5  Registration 3  Attention/ Calculation 5  Recall 3  Language- name 2 objects 2  Language- repeat 1  Language- follow 3 step command 3  Language- read & follow direction 1  Write a sentence 1  Copy design 1  Total score 30        Immunizations Immunization History  Administered Date(s) Administered   PFIZER(Purple Top)SARS-COV-2 Vaccination 09/30/2019, 10/27/2019, 06/12/2020    TDAP status: Due, Education has been provided regarding the importance of this vaccine. Advised may receive this vaccine at local pharmacy or Health Dept. Aware to provide a copy of the vaccination record if obtained from local pharmacy or Health Dept. Verbalized acceptance and understanding.  Flu Vaccine status: Due, Education has been provided regarding the importance of this vaccine. Advised may receive this vaccine at local pharmacy or Health Dept. Aware to provide a copy of the vaccination record if  obtained from local pharmacy or Health Dept. Verbalized acceptance and understanding.  Pneumococcal vaccine status: Declined,  Education has been provided regarding the importance of this vaccine but patient still declined. Advised may receive this vaccine at local pharmacy or Health Dept. Aware to provide a copy of the vaccination record if obtained from local pharmacy or Health Dept. Verbalized acceptance and understanding.   Covid-19 vaccine status: Completed vaccines  Qualifies for Shingles Vaccine? Yes   Zostavax completed No   Shingrix Completed?: No.    Education has been provided regarding the importance of this vaccine. Patient has been advised to call insurance company to determine out of pocket expense if they have not yet received this vaccine. Advised may also receive vaccine at local pharmacy or Health Dept. Verbalized acceptance and understanding.  Screening Tests Health Maintenance  Topic Date Due   COVID-19 Vaccine (4 - Booster for Pfizer series) 01/21/2021 (Originally 10/10/2020)   Zoster Vaccines- Shingrix (1 of 2) 04/07/2021 (Originally 06/17/2003)   TETANUS/TDAP  07/07/2021 (Originally [birth year]16/1972)   PNA vac Low Risk Adult (1 of 2 - PCV13) 07/07/2021 (Originally 06/16/2018)   COLONOSCOPY (Pts [id number] Insurance coverage will need to be confirmed)  01/05/2022 (Originally 06/16/1998)   Hepatitis C Screening  01/05/2022 (Originally 06/17/1969)   INFLUENZA VACCINE  02/26/2021   HPV VACCINES  Aged Out    Health Maintenance  There are no preventive care reminders to display for this patient.  Colorectal cancer screening: Type of screening: Cologuard. Completed 2022. Repeat every 1 years  Lung Cancer Screening: (Low Dose CT Chest recommended if Age 27-80 years, 30 pack-year currently smoking OR have quit w/in 15years.) does not qualify.   Lung Cancer Screening Referral: NA  Additional Screening:  Hepatitis C Screening: does qualify; Completed to be completed by  PCP  Vision Screening: Recommended annual ophthalmology exams for early detection of glaucoma and other disorders of the eye. Is the patient up to date with their annual eye exam?  No  Who is the provider or what is the name of the office in which the patient  attends annual eye exams? none If pt is not established with a provider, would they like to be referred to a provider to establish care? No .   Dental Screening: Recommended annual dental exams for proper oral hygiene  Community Resource Referral / Chronic Care Management: CRR required this visit?  No   CCM required this visit?  No      Plan:     I have personally reviewed and noted the following in the patient's chart:   Medical and social history Use of alcohol, tobacco or illicit drugs  Current medications and supplements including opioid prescriptions. Patient is not currently taking opioid prescriptions. Functional ability and status Nutritional status Physical activity Advanced directives List of other physicians Hospitalizations, surgeries, and ER visits in previous 12 months Vitals Screenings to include cognitive, depression, and falls Referrals and appointments  In addition, I have reviewed and discussed with patient certain preventive protocols, quality metrics, and best practice recommendations. A written personalized care plan for preventive services as well as general preventive health recommendations were provided to patient.     Ivonne Andrew, NP   01/09/2021

## 2021-01-09 NOTE — Patient Instructions (Addendum)
Mr. Latorre , Thank you for taking time to come for your Medicare Wellness Visit. I appreciate your ongoing commitment to your health goals. Please review the following plan we discussed and let me know if I can assist you in the future.   These are the goals we discussed:  Goals   None     This is a list of the screening recommended for you and due dates:  Health Maintenance  Topic Date Due   COVID-19 Vaccine (4 - Booster for Pfizer series) 01/21/2021*   Zoster (Shingles) Vaccine (1 of 2) 04/07/2021*   Tetanus Vaccine  07/07/2021*   Pneumonia vaccines (1 of 2 - PCV13) 07/07/2021*   Colon Cancer Screening  01/05/2022*   Hepatitis C Screening: USPSTF Recommendation to screen - Ages 18-79 yo.  01/05/2022*   Flu Shot  02/26/2021   HPV Vaccine  Aged Out  *Topic was postponed. The date shown is not the original due date.    Influenza Virus Vaccine injection What is this medication? INFLUENZA VIRUS VACCINE (in floo EN zuh VAHY ruhs vak SEEN) helps to reduce the risk of getting influenza also known as the flu. The vaccine only helps protectyou against some strains of the flu. This medicine may be used for other purposes; ask your health care provider orpharmacist if you have questions. COMMON BRAND NAME(S): Afluria, Afluria Quadrivalent, Agriflu, Alfuria, FLUAD, FLUAD Quadrivalent, Fluarix, Fluarix Quadrivalent, Flublok, Flublok Quadrivalent, FLUCELVAX, FLUCELVAX Quadrivalent, Flulaval, Flulaval Quadrivalent, Fluvirin, Fluzone, Fluzone High-Dose, Fluzone Intradermal,Fluzone Quadrivalent What should I tell my care team before I take this medication? They need to know if you have any of these conditions: bleeding disorder like hemophilia fever or infection Guillain-Barre syndrome or other neurological problems immune system problems infection with the human immunodeficiency virus (HIV) or AIDS low blood platelet counts multiple sclerosis an unusual or allergic reaction to influenza virus  vaccine, latex, other medicines, foods, dyes, or preservatives. Different brands of vaccines contain different allergens. Some may contain latex or eggs. Talk to your doctor about your allergies to make sure that you get the right vaccine. pregnant or trying to get pregnant breast-feeding How should I use this medication? This vaccine is for injection into a muscle or under the skin. It is given by ahealth care professional. A copy of Vaccine Information Statements will be given before each vaccination.Read this sheet carefully each time. The sheet may change frequently. Talk to your healthcare provider to see which vaccines are right for you. Somevaccines should not be used in all age groups. Overdosage: If you think you have taken too much of this medicine contact apoison control center or emergency room at once. NOTE: This medicine is only for you. Do not share this medicine with others. What if I miss a dose? This does not apply. What may interact with this medication? chemotherapy or radiation therapy medicines that lower your immune system like etanercept, anakinra, infliximab, and adalimumab medicines that treat or prevent blood clots like warfarin phenytoin steroid medicines like prednisone or cortisone theophylline vaccines This list may not describe all possible interactions. Give your health care provider a list of all the medicines, herbs, non-prescription drugs, or dietary supplements you use. Also tell them if you smoke, drink alcohol, or use illegaldrugs. Some items may interact with your medicine. What should I watch for while using this medication? Report any side effects that do not go away within 3 days to your doctor or health care professional. Call your health care provider if any unusualsymptoms  occur within 6 weeks of receiving this vaccine. You may still catch the flu, but the illness is not usually as bad. You cannot get the flu from the vaccine. The vaccine will not  protect against colds orother illnesses that may cause fever. The vaccine is needed every year. What side effects may I notice from receiving this medication? Side effects that you should report to your doctor or health care professionalas soon as possible: allergic reactions like skin rash, itching or hives, swelling of the face, lips, or tongue Side effects that usually do not require medical attention (report to yourdoctor or health care professional if they continue or are bothersome): fever headache muscle aches and pains pain, tenderness, redness, or swelling at the injection site tiredness Side effects that you should report to your doctor or health care professionalas soon as possible: allergic reactions like skin rash, itching or hives, swelling of the face, lips, or tongue Side effects that usually do not require medical attention (report to yourdoctor or health care professional if they continue or are bothersome): fever headache muscle aches and pains pain, tenderness, redness, or swelling at the injection site tiredness This list may not describe all possible side effects. Call your doctor for medical advice about side effects. You may report side effects to FDA at1-800-FDA-1088. Where should I keep my medication? The vaccine will be given by a health care professional in a clinic, pharmacy, doctor's office, or other health care setting. You will not be given vaccinedoses to store at home. NOTE: This sheet is a summary. It may not cover all possible information. If you have questions about this medicine, talk to your doctor, pharmacist, orhealth care provider.  2022 Elsevier/Gold Standard (2020-03-21 19:49:22)  Alcohol Abuse and Dependence Information, Adult Alcohol is a widely available drug. People drink alcohol in different amounts. People who drink alcohol very often and in large amounts often have problems during and after drinking. They may develop what is called an  alcohol use disorder. There are two main types of alcohol use disorders: Alcohol abuse. This is when you use alcohol too much or too often. You may use alcohol to make yourself feel happy or to reduce stress. You may have a hard time setting a limit on the amount you drink. Alcohol dependence. This is when you use alcohol consistently for a period of time, and your body changes as a result. This can make it hard to stop drinking because you may start to feel sick or feel different when you do not use alcohol. These symptoms are known as withdrawal. How can alcohol abuse and dependence affect me? Alcohol abuse and dependence can have a negative effect on your life. Drinking too much can lead to addiction. You may feel like you need alcohol to function normally. You may drink alcohol before work in the morning, during the day, or as soon as you get home from work in the evening. These actions can result in: Poor work performance. Job loss. Financial problems. Car crashes or criminal charges from driving after drinking alcohol. Problems in your relationships with friends and family. Losing the trust and respect of coworkers, friends, and family. Drinking heavily over a long period of time can permanently damage your body and brain, and can cause lifelong health issues, such as: Damage to your liver or pancreas. Heart problems, high blood pressure, or stroke. Certain cancers. Decreased ability to fight infections. Brain or nerve damage. Depression. Early (premature) death. If you are careless or you  crave alcohol, it is easy to drink more than your body can handle (overdose). Alcohol overdose is a serious situation that requires hospitalization. Itmay lead to permanent injuries or death. What can increase my risk? Having a family history of alcohol abuse. Having depression or other mental health conditions. Beginning to drink at an early age. Binge drinking often. Experiencing trauma, stress,  and an unstable home life during childhood. Spending time with people who drink often. What actions can I take to prevent or manage alcohol abuse and dependence? Do not drink alcohol if: Your health care provider tells you not to drink. You are pregnant, may be pregnant, or are planning to become pregnant. If you drink alcohol: Limit how much you use to: 0-1 drink a day for women. 0-2 drinks a day for men. Be aware of how much alcohol is in your drink. In the [place] one drink equals one 12 oz bottle of beer (355 mL), one 5 oz glass of wine (148 mL), or one 1 oz glass of hard liquor (44 mL). Stop drinking if you have been drinking too much. This can be very hard to do if you are used to abusing alcohol. If you begin to have withdrawal symptoms, talk with your health care provider or a person that you trust. These symptoms may include anxiety, shaky hands, headache, nausea, sweating, or not being able to sleep. Choose to drink nonalcoholic beverages in social gatherings and places where there may be alcohol. Activity Spend more time on activities that you enjoy that do not involve alcohol, like hobbies or exercise. Find healthy ways to cope with stress, such as exercise, meditation, or spending time with people you care about. General information Talk to your family, coworkers, and friends about supporting you in your efforts to stop drinking. If they drink, ask them not to drink around you. Spend more time with people who do not drink alcohol. If you think that you have an alcohol dependency problem: Tell friends or family about your concerns. Talk with your health care provider or another health professional about where to get help. Work with a Paramedic and a Network engineer. Consider joining a support group for people who struggle with alcohol abuse and dependence. Where to find support  Your health care provider. SMART Recovery: www.smartrecovery.org Therapy and support  groups Local treatment centers or chemical dependency counselors. Local AA groups in your community: SalaryStart.tn Where to find more information Centers for Disease Control and Prevention: FootballExhibition.com.br [employer] on Alcohol Abuse and Alcoholism: BasicStudents.dk Alcoholics Anonymous (AA): SalaryStart.tn Contact a health care provider if: You drank more or for longer than you intended on more than one occasion. You tried to stop drinking or to cut back on how much you drink, but you were not able to. You often drink to the point of vomiting or passing out. You want to drink so badly that you cannot think about anything else. You have problems in your life due to drinking, but you continue to drink. You keep drinking even though you feel anxious, depressed, or have experienced memory loss. You have stopped doing the things you used to enjoy in order to drink. You have to drink more than you used to in order to get the effect you want. You experience anxiety, sweating, nausea, shakiness, and trouble sleeping when you try to stop drinking. Get help right away if: You have thoughts about hurting yourself or others. You have serious withdrawal symptoms, including: Confusion. Racing heart. High blood  pressure. Fever. If you ever feel like you may hurt yourself or others, or have thoughts about taking your own life, get help right away. You can go to your nearest emergency department or call: Your local emergency services (911 in the [place] A suicide crisis helpline, such as the National Suicide Prevention Lifeline at (502)625-7135. This is open 24 hours a day. Summary Alcohol abuse and dependence can have a negative effect on your life. Drinking too much or too often can lead to addiction. If you drink alcohol, limit how much you use. If you are having trouble keeping your drinking under control, find ways to change your behavior. Hobbies, calming activities, exercise, or support groups can  help. If you feel you need help with changing your drinking habits, talk with your health care provider, a good friend, or a therapist, or go to an AA group. This information is not intended to replace advice given to you by your health care provider. Make sure you discuss any questions you have with your healthcare provider. Document Revised: 11/03/2018 Document Reviewed: 09/22/2018 Elsevier Patient Education  2022 [employer].  Fall Prevention in the Home, Adult Falls can cause injuries and can happen to people of all ages. There are many things you can do to make your home safe and to help prevent falls. Ask forhelp when making these changes. What actions can I take to prevent falls? General Instructions Use good lighting in all rooms. Replace any light bulbs that burn out. Turn on the lights in dark areas. Use night-lights. Keep items that you use often in easy-to-reach places. Lower the shelves around your home if needed. Set up your furniture so you have a clear path. Avoid moving your furniture around. Do not have throw rugs or other things on the floor that can make you trip. Avoid walking on wet floors. If any of your floors are uneven, fix them. Add color or contrast paint or tape to clearly mark and help you see: Grab bars or handrails. First and last steps of staircases. Where the edge of each step is. If you use a stepladder: Make sure that it is fully opened. Do not climb a closed stepladder. Make sure the sides of the stepladder are locked in place. Ask someone to hold the stepladder while you use it. Know where your pets are when moving through your home. What can I do in the bathroom?     Keep the floor dry. Clean up any water on the floor right away. Remove soap buildup in the tub or shower. Use nonskid mats or decals on the floor of the tub or shower. Attach bath mats securely with double-sided, nonslip rug tape. If you need to sit down in the shower, use a  plastic, nonslip stool. Install grab bars by the toilet and in the tub and shower. Do not use towel bars as grab bars. What can I do in the bedroom? Make sure that you have a light by your bed that is easy to reach. Do not use any sheets or blankets for your bed that hang to the floor. Have a firm chair with side arms that you can use for support when you get dressed. What can I do in the kitchen? Clean up any spills right away. If you need to reach something above you, use a step stool with a grab bar. Keep electrical cords out of the way. Do not use floor polish or wax that makes floors slippery. What can I  do with my stairs? Do not leave any items on the stairs. Make sure that you have a light switch at the top and the bottom of the stairs. Make sure that there are handrails on both sides of the stairs. Fix handrails that are broken or loose. Install nonslip stair treads on all your stairs. Avoid having throw rugs at the top or bottom of the stairs. Choose a carpet that does not hide the edge of the steps on the stairs. Check carpeting to make sure that it is firmly attached to the stairs. Fix carpet that is loose or worn. What can I do on the outside of my home? Use bright outdoor lighting. Fix the edges of walkways and driveways and fix any cracks. Remove anything that might make you trip as you walk through a door, such as a raised step or threshold. Trim any bushes or trees on paths to your home. Check to see if handrails are loose or broken and that both sides of all steps have handrails. Install guardrails along the edges of any raised decks and porches. Clear paths of anything that can make you trip, such as tools or rocks. Have leaves, snow, or ice cleared regularly. Use sand or salt on paths during winter. Clean up any spills in your garage right away. This includes grease or oil spills. What other actions can I take? Wear shoes that: Have a low heel. Do not wear high  heels. Have rubber bottoms. Feel good on your feet and fit well. Are closed at the toe. Do not wear open-toe sandals. Use tools that help you move around if needed. These include: Canes. Walkers. Scooters. Crutches. Review your medicines with your doctor. Some medicines can make you feel dizzy. This can increase your chance of falling. Ask your doctor what else you can do to help prevent falls. Where to find more information Centers for Disease Control and Prevention, [employer]: FootballExhibition.com.br [employer] on Aging: https://walker.com/ Contact a doctor if: You are afraid of falling at home. You feel weak, drowsy, or dizzy at home. You fall at home. Summary There are many simple things that you can do to make your home safe and to help prevent falls. Ways to make your home safe include removing things that can make you trip and installing grab bars in the bathroom. Ask for help when making these changes in your home. This information is not intended to replace advice given to you by your health care provider. Make sure you discuss any questions you have with your healthcare provider. Document Revised: 02/16/2020 Document Reviewed: 02/16/2020 Elsevier Patient Education  2022 Elsevier Inc.  Health Maintenance, Male Adopting a healthy lifestyle and getting preventive care are important in promoting health and wellness. Ask your health care provider about: The right schedule for you to have regular tests and exams. Things you can do on your own to prevent diseases and keep yourself healthy. What should I know about diet, weight, and exercise? Eat a healthy diet  Eat a diet that includes plenty of vegetables, fruits, low-fat dairy products, and lean protein. Do not eat a lot of foods that are high in solid fats, added sugars, or sodium.  Maintain a healthy weight Body mass index (BMI) is a measurement that can be used to identify possible weight problems. It estimates body fat based on  height and weight. Your health care provider can help determine your BMI and help you achieve or maintain ahealthy weight. Get regular exercise Get regular  exercise. This is one of the most important things you can do for your health. Most adults should: Exercise for at least 150 minutes each week. The exercise should increase your heart rate and make you sweat (moderate-intensity exercise). Do strengthening exercises at least twice a week. This is in addition to the moderate-intensity exercise. Spend less time sitting. Even light physical activity can be beneficial. Watch cholesterol and blood lipids Have your blood tested for lipids and cholesterol at 68 years of age, then havethis test every 5 years. You may need to have your cholesterol levels checked more often if: Your lipid or cholesterol levels are high. You are older than 68 years of age. You are at high risk for heart disease. What should I know about cancer screening? Many types of cancers can be detected early and may often be prevented. Depending on your health history and family history, you may need to have cancer screening at various ages. This may include screening for: Colorectal cancer. Prostate cancer. Skin cancer. Lung cancer. What should I know about heart disease, diabetes, and high blood pressure? Blood pressure and heart disease High blood pressure causes heart disease and increases the risk of stroke. This is more likely to develop in people who have high blood pressure readings, are of African descent, or are overweight. Talk with your health care provider about your target blood pressure readings. Have your blood pressure checked: Every 3-5 years if you are 5-76 years of age. Every year if you are 6 years old or older. If you are between the ages of 81 and 74 and are a current or former smoker, ask your health care provider if you should have a one-time screening for abdominal aortic aneurysm  (AAA). Diabetes Have regular diabetes screenings. This checks your fasting blood sugar level. Have the screening done: Once every three years after age 57 if you are at a normal weight and have a low risk for diabetes. More often and at a younger age if you are overweight or have a high risk for diabetes. What should I know about preventing infection? Hepatitis B If you have a higher risk for hepatitis B, you should be screened for this virus. Talk with your health care provider to find out if you are at risk forhepatitis B infection. Hepatitis C Blood testing is recommended for: Everyone born from [birth year] through 1963. Anyone with known risk factors for hepatitis C. Sexually transmitted infections (STIs) You should be screened each year for STIs, including gonorrhea and chlamydia, if: You are sexually active and are younger than 68 years of age. You are older than 68 years of age and your health care provider tells you that you are at risk for this type of infection. Your sexual activity has changed since you were last screened, and you are at increased risk for chlamydia or gonorrhea. Ask your health care provider if you are at risk. Ask your health care provider about whether you are at high risk for HIV. Your health care provider may recommend a prescription medicine to help prevent HIV infection. If you choose to take medicine to prevent HIV, you should first get tested for HIV. You should then be tested every 3 months for as long as you are taking the medicine. Follow these instructions at home: Lifestyle Do not use any products that contain nicotine or tobacco, such as cigarettes, e-cigarettes, and chewing tobacco. If you need help quitting, ask your health care provider. Do not use street drugs. Do  not share needles. Ask your health care provider for help if you need support or information about quitting drugs. Alcohol use Do not drink alcohol if your health care provider tells you not  to drink. If you drink alcohol: Limit how much you have to 0-2 drinks a day. Be aware of how much alcohol is in your drink. In the [place] one drink equals one 12 oz bottle of beer (355 mL), one 5 oz glass of wine (148 mL), or one 1 oz glass of hard liquor (44 mL). General instructions Schedule regular health, dental, and eye exams. Stay current with your vaccines. Tell your health care provider if: You often feel depressed. You have ever been abused or do not feel safe at home. Summary Adopting a healthy lifestyle and getting preventive care are important in promoting health and wellness. Follow your health care provider's instructions about healthy diet, exercising, and getting tested or screened for diseases. Follow your health care provider's instructions on monitoring your cholesterol and blood pressure. This information is not intended to replace advice given to you by your health care provider. Make sure you discuss any questions you have with your healthcare provider. Document Revised: 07/08/2018 Document Reviewed: 07/08/2018 Elsevier Patient Education  2022 Elsevier Inc.  Steps to Quit Smoking Smoking tobacco is the leading cause of preventable death. It can affect almost every organ in the body. Smoking puts you and people around you at risk for many serious, long-lasting (chronic) diseases. Quitting smoking can be hard, but it is one of the best things thatyou can do for your health. It is never too late to quit. How do I get ready to quit? When you decide to quit smoking, make a plan to help you succeed. Before you quit: Pick a date to quit. Set a date within the next 2 weeks to give you time to prepare. Write down the reasons why you are quitting. Keep this list in places where you will see it often. Tell your family, friends, and co-workers that you are quitting. Their support is important. Talk with your doctor about the choices that may help you quit. Find out if your health  insurance will pay for these treatments. Know the people, places, things, and activities that make you want to smoke (triggers). Avoid them. What first steps can I take to quit smoking? Throw away all cigarettes at home, at work, and in your car. Throw away the things that you use when you smoke, such as ashtrays and lighters. Clean your car. Make sure to empty the ashtray. Clean your home, including curtains and carpets. What can I do to help me quit smoking? Talk with your doctor about taking medicines and seeing a counselor at the same time. You are more likely to succeed when you do both. If you are pregnant or breastfeeding, talk with your doctor about counseling or other ways to quit smoking. Do not take medicine to help you quit smoking unless your doctor tells you to do so. To quit smoking: Quit right away Quit smoking totally, instead of slowly cutting back on how much you smoke over a period of time. Go to counseling. You are more likely to quit if you go to counseling sessions regularly. Take medicine You may take medicines to help you quit. Some medicines need a prescription, and some you can buy over-the-counter. Some medicines may contain a drug called nicotine to replace the nicotine in cigarettes. Medicines may: Help you to stop having the desire to  smoke (cravings). Help to stop the problems that come when you stop smoking (withdrawal symptoms). Your doctor may ask you to use: Nicotine patches, gum, or lozenges. Nicotine inhalers or sprays. Non-nicotine medicine that is taken by mouth. Find resources Find resources and other ways to help you quit smoking and remain smoke-free after you quit. These resources are most helpful when you use them often. They include: Online chats with a Veterinary surgeon. Phone quitlines. Printed Materials engineer. Support groups or group counseling. Text messaging programs. Mobile phone apps. Use apps on your mobile phone or tablet that can help  you stick to your quit plan. There are many free apps for mobile phones and tablets as well as websites. Examples include Quit Guide from the [employer] and smokefree.gov  What things can I do to make it easier to quit?  Talk to your family and friends. Ask them to support and encourage you. Call a phone quitline (1-800-QUIT-NOW), reach out to support groups, or work with a Veterinary surgeon. Ask people who smoke to not smoke around you. Avoid places that make you want to smoke, such as: Bars. Parties. Smoke-break areas at work. Spend time with people who do not smoke. Lower the stress in your life. Stress can make you want to smoke. Try these things to help your stress: Getting regular exercise. Doing deep-breathing exercises. Doing yoga. Meditating. Doing a body scan. To do this, close your eyes, focus on one area of your body at a time from head to toe. Notice which parts of your body are tense. Try to relax the muscles in those areas. How will I feel when I quit smoking? Day 1 to 3 weeks Within the first 24 hours, you may start to have some problems that come from quitting tobacco. These problems are very bad 2-3 days after you quit, but they do not often last for more than 2-3 weeks. You may get these symptoms: Mood swings. Feeling restless, nervous, angry, or annoyed. Trouble concentrating. Dizziness. Strong desire for high-sugar foods and nicotine. Weight gain. Trouble pooping (constipation). Feeling like you may vomit (nausea). Coughing or a sore throat. Changes in how the medicines that you take for other issues work in your body. Depression. Trouble sleeping (insomnia). Week 3 and afterward After the first 2-3 weeks of quitting, you may start to notice more positive results, such as: Better sense of smell and taste. Less coughing and sore throat. Slower heart rate. Lower blood pressure. Clearer skin. Better breathing. Fewer sick days. Quitting smoking can be hard. Do not give up  if you fail the first time. Some people need to try a few times before they succeed. Do your best to stick to your quit plan, and talk with yourdoctor if you have any questions or concerns. Summary Smoking tobacco is the leading cause of preventable death. Quitting smoking can be hard, but it is one of the best things that you can do for your health. When you decide to quit smoking, make a plan to help you succeed. Quit smoking right away, not slowly over a period of time. When you start quitting, seek help from your doctor, family, or friends. This information is not intended to replace advice given to you by your health care provider. Make sure you discuss any questions you have with your healthcare provider. Document Revised: 04/09/2019 Document Reviewed: 10/03/2018 Elsevier Patient Education  2022 [employer].

## 2021-01-14 ENCOUNTER — Other Ambulatory Visit: Payer: Self-pay | Admitting: Nurse Practitioner

## 2021-01-14 DIAGNOSIS — E039 Hypothyroidism, unspecified: Secondary | ICD-10-CM

## 2021-01-14 NOTE — Progress Notes (Signed)
   [facility] [address] [place], [place]  [address] Phone:  [phone]   Fax:  [phone] 

## 2021-01-15 ENCOUNTER — Other Ambulatory Visit: Payer: Self-pay | Admitting: Nurse Practitioner

## 2021-01-15 ENCOUNTER — Telehealth: Payer: Self-pay | Admitting: Nurse Practitioner

## 2021-01-15 MED ORDER — LEVOTHYROXINE SODIUM 125 MCG PO TABS: 125.0000 ug | ORAL_TABLET | Freq: Every day | ORAL | 3 refills | Status: DC

## 2021-01-15 NOTE — Telephone Encounter (Signed)
Left voice mail to call back 

## 2021-01-15 NOTE — Telephone Encounter (Signed)
-----   Message from Barbette Merino, NP sent at 01/14/2021  6:37 PM EDT ----- MyChart message sent to the patient. However additional follow up maybe needed. Thanks

## 2021-01-15 NOTE — Progress Notes (Signed)
   [facility] [address] [place], [place]  [address] Phone:  [phone]   Fax:  [phone] 

## 2021-01-15 NOTE — Telephone Encounter (Signed)
Pt called back and said he missed the call and wanted for [place] to call him again

## 2021-01-16 NOTE — Telephone Encounter (Signed)
Patient agreed to lowering dose of levothyroxine. He is coming back for labs onJuly 29th.

## 2021-02-23 ENCOUNTER — Other Ambulatory Visit: Payer: Medicare HMO

## 2021-02-23 ENCOUNTER — Other Ambulatory Visit: Payer: Self-pay

## 2021-02-23 DIAGNOSIS — E039 Hypothyroidism, unspecified: Secondary | ICD-10-CM

## 2021-02-24 LAB — TSH+T4F+T3FREE
Free T4: 1.48 ng/dL (ref 0.82–1.77)
T3, Free: 2.9 pg/mL (ref 2.0–4.4)
TSH: 1.12 u[IU]/mL (ref 0.450–4.500)

## 2021-03-22 DIAGNOSIS — R69 Illness, unspecified: Secondary | ICD-10-CM | POA: Diagnosis not present

## 2021-03-30 ENCOUNTER — Other Ambulatory Visit: Payer: Self-pay

## 2021-03-30 DIAGNOSIS — E785 Hyperlipidemia, unspecified: Secondary | ICD-10-CM

## 2021-03-30 MED ORDER — ATORVASTATIN CALCIUM 20 MG PO TABS: 20.0000 mg | ORAL_TABLET | Freq: Every day | ORAL | 3 refills | Status: DC

## 2021-05-29 ENCOUNTER — Other Ambulatory Visit: Payer: Self-pay

## 2021-05-29 DIAGNOSIS — I10 Essential (primary) hypertension: Secondary | ICD-10-CM

## 2021-05-29 MED ORDER — AMLODIPINE BESYLATE 10 MG PO TABS: ORAL_TABLET | ORAL | 1 refills | Status: DC

## 2021-07-09 ENCOUNTER — Encounter: Payer: Self-pay | Admitting: Nurse Practitioner

## 2021-07-09 ENCOUNTER — Ambulatory Visit (INDEPENDENT_AMBULATORY_CARE_PROVIDER_SITE_OTHER): Payer: Medicare HMO | Admitting: Nurse Practitioner

## 2021-07-09 ENCOUNTER — Other Ambulatory Visit: Payer: Self-pay

## 2021-07-09 VITALS — BP 121/72 | HR 83 | Temp 97.7°F | Ht 70.0 in | Wt 194.6 lb

## 2021-07-09 DIAGNOSIS — I10 Essential (primary) hypertension: Secondary | ICD-10-CM

## 2021-07-09 DIAGNOSIS — F200 Paranoid schizophrenia: Secondary | ICD-10-CM | POA: Diagnosis not present

## 2021-07-09 DIAGNOSIS — E039 Hypothyroidism, unspecified: Secondary | ICD-10-CM

## 2021-07-09 DIAGNOSIS — R69 Illness, unspecified: Secondary | ICD-10-CM | POA: Diagnosis not present

## 2021-07-09 DIAGNOSIS — E785 Hyperlipidemia, unspecified: Secondary | ICD-10-CM | POA: Diagnosis not present

## 2021-07-09 NOTE — Patient Instructions (Signed)

## 2021-07-09 NOTE — Progress Notes (Signed)
[place], [place]  [address] Phone:  [phone]   Fax:  [phone]   Established Patient Office Visit  Subjective:  Patient ID: Omar Rogers, male    DOB: 1951/03/13  Age: 70 y.o. MRN: [id number]  CC:  Chief Complaint  Patient presents with   Follow-up    Pt is here today for his 6 month follow up visit. Pt has no concerns to address.    HPI Omar Rogers presents for follow up. 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  Hypothyroidism Omar Rogers is a 70 y.o. male who presents for follow up of hypothyroidism. Current symptoms: none . Patient denies change in energy level, diarrhea, heat / cold intolerance, nervousness, palpitations, and weight changes. Symptoms have stabilized.  Hypertension Patient is here for follow-up of elevated blood pressure. 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 Blood pressure is not well controlled at home. Cardiac symptoms: none. Patient denies chest pain, chest pressure/discomfort, claudication, dyspnea, exertional chest pressure/discomfort, fatigue, irregular heart beat, lower extremity edema, near-syncope, orthopnea, palpitations, paroxysmal nocturnal dyspnea, syncope, and tachypnea. Cardiovascular risk factors: advanced age (older than 73 for men, 19 for women), dyslipidemia, hypertension, male gender, and sedentary lifestyle. Use of agents associated with hypertension: thyroid hormones. History of target organ damage: none. Past Medical History:  Diagnosis Date   Bipolar disorder ([place])    DVT (deep venous thrombosis) ([place]) 06/29/2013   extensive left iliofemoral  popliteal DVT/notes (06/29/2013)   Hypothyroidism 02/2019   Increased thyroid stimulating hormone (TSH) level 12/2018   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   Vitamin B 12 deficiency 12/2018   Vitamin D deficiency 12/2018    Past Surgical History:  Procedure Laterality Date   ANKLE FRACTURE SURGERY Right ~ 2012    Family History  Problem Relation Age of Onset   Hypertension Mother    Cancer Mother    Hypertension Father     Social History   Socioeconomic History   Marital status: Single    Spouse name: Not on file   Number of children: Not on file   Years of education: Not on file   Highest education level: Not on file  Occupational History   Not on file  Tobacco Use   Smoking status: Former    Packs/day: 0.50    Years: 5.00    Pack years: 2.50    Types: Cigarettes   Smokeless tobacco: Never   Tobacco comments:    06/29/2013 "quit smoking ~ 2 yr ago"  Vaping Use   Vaping Use: Never used  Substance and Sexual Activity   Alcohol use: Yes    Alcohol/week: 42.0 standard drinks    Types: 42 Cans of beer per week    Comment: occ   Drug use: No   Sexual activity: Not Currently  Other Topics Concern   Not on file  Social History Narrative   Not on file   Social Determinants of Health   Financial Resource Strain: Low Risk    Difficulty of Paying Living Expenses: Not hard at all  Food Insecurity: No Food Insecurity   Worried About [place] in the Last Year: Never true  Ran Out of Food in the Last Year: Never true  Transportation Needs: No Transportation Needs   Lack of Transportation (Medical): No   Lack of Transportation (Non-Medical): No  Physical Activity: Inactive   Days of Exercise per Week: 0 days   Minutes of Exercise per Session: 0 min  Stress: No Stress Concern Present   Feeling of Stress : Not at all  Social Connections: Socially Isolated   Frequency of Communication with Friends and Family: Twice a week   Frequency of Social Gatherings with  Friends and Family: Once a week   Attends Religious Services: Never   Marine scientist or Organizations: No   Attends Music therapist: Never   Marital Status: Divorced  Human resources officer Violence: Not At Risk   Fear of Current or Ex-Partner: No   Emotionally Abused: No   Physically Abused: No   Sexually Abused: No    Outpatient Medications Prior to Visit  Medication Sig Dispense Refill   amLODipine (NORVASC) 10 MG tablet TAKE 1 TABLET(10 MG) BY MOUTH DAILY 90 tablet 1   atorvastatin (LIPITOR) 20 MG tablet Take 1 tablet (20 mg total) by mouth daily. 90 tablet 3   levothyroxine (SYNTHROID) 125 MCG tablet Take 1 tablet (125 mcg total) by mouth daily. 90 tablet 3   Multiple Vitamins-Minerals (MULTIVITAMIN WITH MINERALS) tablet Take 1 tablet by mouth daily.     QUEtiapine (SEROQUEL) 100 MG tablet Take 100 mg by mouth at bedtime.     No facility-administered medications prior to visit.    Allergies  Allergen Reactions   Chlorpromazine Other (See Comments)    unknown reaction- reported from previous hospital records   Haloperidol Other (See Comments)    Unknown reaction-reported via hospital records    ROS Review of Systems    Objective:    Physical Exam HENT:     Head: Normocephalic and atraumatic.     Nose: Nose normal.     Mouth/Throat:     Mouth: Mucous membranes are moist.  Cardiovascular:     Rate and Rhythm: Normal rate and regular rhythm.     Pulses: Normal pulses.     Heart sounds: Normal heart sounds.  Pulmonary:     Effort: Pulmonary effort is normal.     Breath sounds: Normal breath sounds.  Abdominal:     Palpations: Abdomen is soft.  Musculoskeletal:        General: Normal range of motion.     Cervical back: Normal range of motion.     Right lower leg: No edema.     Left lower leg: No edema.  Skin:    General: Skin is warm and dry.     Capillary Refill: Capillary refill takes less than 2 seconds.  Neurological:     General: No  focal deficit present.     Mental Status: He is alert and oriented to person, place, and time.  Psychiatric:        Mood and Affect: Mood normal.        Behavior: Behavior normal.        Thought Content: Thought content normal.        Judgment: Judgment normal.    BP 121/72   Pulse 83   Temp 97.7 F (36.5 C)   Ht 5' 10"  (1.778 m)   Wt 194 lb 9.6 oz (88.3 kg)   SpO2 95%   BMI 27.92 kg/m  Wt Readings from Last 3 Encounters:  07/09/21 194 lb 9.6  oz (88.3 kg)  01/09/21 189 lb (85.7 kg)  01/05/21 184 lb 9.6 oz (83.7 kg)     There are no preventive care reminders to display for this patient.   There are no preventive care reminders to display for this patient.  Lab Results  Component Value Date   TSH 1.120 02/23/2021   Lab Results  Component Value Date   WBC 7.9 01/05/2021   HGB 17.1 01/05/2021   HCT 48.9 01/05/2021   MCV 101 (H) 01/05/2021   PLT 250 01/05/2021   Lab Results  Component Value Date   NA 141 01/05/2021   K 4.5 01/05/2021   CO2 19 (L) 02/07/2020   GLUCOSE 87 01/05/2021   BUN 11 01/05/2021   CREATININE 1.06 01/05/2021   BILITOT 0.7 01/05/2021   ALKPHOS 93 01/05/2021   AST 27 01/05/2021   ALT 65 (H) 02/07/2020   PROT 7.0 01/05/2021   ALBUMIN 4.4 01/05/2021   CALCIUM 9.8 01/05/2021   ANIONGAP 7 06/23/2017   EGFR 77 01/05/2021   Lab Results  Component Value Date   CHOL 148 01/05/2021   Lab Results  Component Value Date   HDL 42 01/05/2021   Lab Results  Component Value Date   LDLCALC 79 01/05/2021   Lab Results  Component Value Date   TRIG 155 (H) 01/05/2021   Lab Results  Component Value Date   CHOLHDL 3.5 01/05/2021   Lab Results  Component Value Date   HGBA1C 5.3 01/05/2021      Assessment & Plan:   Problem List Items Addressed This Visit       Cardiovascular and Mediastinum   Essential hypertension, benign Stable  Encouraged on going compliance with current medication regimen Encouraged home monitoring and recording  BP <130/80 Eating a heart-healthy diet with less salt Encouraged regular physical activity  Recommend Weight loss     Relevant Orders   Comp. Metabolic Panel (12)     Other   Schizophrenia, paranoid, chronic with acute exacerbation ([place]) [facility]   Other Visit Diagnoses     Hypothyroidism, unspecified type    -  Primary Stable Continue with current regimen.  No changes warranted. Good patient compliance.    Relevant Orders   TSH   T3   T4, free   Hyperlipidemia, unspecified hyperlipidemia type     Stable  Heart healthy diet low in fat and cholesterol    Relevant Orders   Lipid panel       No orders of the defined types were placed in this encounter.   Follow-up: Return in about 6 months (around 01/07/2022) for Follow up HTN [id number].    Vevelyn Francois, NP

## 2021-07-10 DIAGNOSIS — F250 Schizoaffective disorder, bipolar type: Secondary | ICD-10-CM | POA: Diagnosis not present

## 2021-07-10 DIAGNOSIS — R69 Illness, unspecified: Secondary | ICD-10-CM | POA: Diagnosis not present

## 2021-07-10 DIAGNOSIS — F331 Major depressive disorder, recurrent, moderate: Secondary | ICD-10-CM | POA: Diagnosis not present

## 2021-07-10 DIAGNOSIS — F319 Bipolar disorder, unspecified: Secondary | ICD-10-CM | POA: Diagnosis not present

## 2021-07-10 DIAGNOSIS — F333 Major depressive disorder, recurrent, severe with psychotic symptoms: Secondary | ICD-10-CM | POA: Diagnosis not present

## 2021-07-10 LAB — COMP. METABOLIC PANEL (12)
AST: 75 IU/L — ABNORMAL HIGH (ref 0–40)
Albumin/Globulin Ratio: 1.9 (ref 1.2–2.2)
Albumin: 4.8 g/dL (ref 3.8–4.8)
Alkaline Phosphatase: 92 IU/L (ref 44–121)
BUN/Creatinine Ratio: 15 (ref 10–24)
BUN: 15 mg/dL (ref 8–27)
Bilirubin Total: 0.9 mg/dL (ref 0.0–1.2)
Calcium: 9.8 mg/dL (ref 8.6–10.2)
Chloride: 98 mmol/L (ref 96–106)
Creatinine, Ser: 1.01 mg/dL (ref 0.76–1.27)
Globulin, Total: 2.5 g/dL (ref 1.5–4.5)
Glucose: 87 mg/dL (ref 70–99)
Potassium: 4.1 mmol/L (ref 3.5–5.2)
Sodium: 139 mmol/L (ref 134–144)
Total Protein: 7.3 g/dL (ref 6.0–8.5)
eGFR: 81 mL/min/{1.73_m2} (ref >59)

## 2021-07-10 LAB — LIPID PANEL
Chol/HDL Ratio: 3.4 ratio (ref 0.0–5.0)
Cholesterol, Total: 190 mg/dL (ref 100–199)
HDL: 56 mg/dL (ref >39)
LDL Chol Calc (NIH): 109 mg/dL — ABNORMAL HIGH (ref 0–99)
Triglycerides: 142 mg/dL (ref 0–149)
VLDL Cholesterol Cal: 25 mg/dL (ref 5–40)

## 2021-07-10 LAB — TSH: TSH: 1.38 u[IU]/mL (ref 0.450–4.500)

## 2021-07-10 LAB — T3: T3, Total: 80 ng/dL (ref 71–180)

## 2021-07-10 LAB — T4, FREE: Free T4: 1.53 ng/dL (ref 0.82–1.77)

## 2021-10-01 DIAGNOSIS — F250 Schizoaffective disorder, bipolar type: Secondary | ICD-10-CM | POA: Diagnosis not present

## 2021-10-01 DIAGNOSIS — R69 Illness, unspecified: Secondary | ICD-10-CM | POA: Diagnosis not present

## 2021-10-02 DIAGNOSIS — M1990 Unspecified osteoarthritis, unspecified site: Secondary | ICD-10-CM | POA: Diagnosis not present

## 2021-10-02 DIAGNOSIS — R69 Illness, unspecified: Secondary | ICD-10-CM | POA: Diagnosis not present

## 2021-10-02 DIAGNOSIS — Z8249 Family history of ischemic heart disease and other diseases of the circulatory system: Secondary | ICD-10-CM | POA: Diagnosis not present

## 2021-10-02 DIAGNOSIS — E039 Hypothyroidism, unspecified: Secondary | ICD-10-CM | POA: Diagnosis not present

## 2021-10-02 DIAGNOSIS — Z818 Family history of other mental and behavioral disorders: Secondary | ICD-10-CM | POA: Diagnosis not present

## 2021-10-02 DIAGNOSIS — Z8051 Family history of malignant neoplasm of kidney: Secondary | ICD-10-CM | POA: Diagnosis not present

## 2021-10-02 DIAGNOSIS — I10 Essential (primary) hypertension: Secondary | ICD-10-CM | POA: Diagnosis not present

## 2021-10-02 DIAGNOSIS — E785 Hyperlipidemia, unspecified: Secondary | ICD-10-CM | POA: Diagnosis not present

## 2021-10-02 DIAGNOSIS — G8929 Other chronic pain: Secondary | ICD-10-CM | POA: Diagnosis not present

## 2021-10-02 DIAGNOSIS — Z791 Long term (current) use of non-steroidal anti-inflammatories (NSAID): Secondary | ICD-10-CM | POA: Diagnosis not present

## 2021-12-04 ENCOUNTER — Other Ambulatory Visit: Payer: Self-pay

## 2021-12-04 DIAGNOSIS — I10 Essential (primary) hypertension: Secondary | ICD-10-CM

## 2021-12-04 MED ORDER — AMLODIPINE BESYLATE 10 MG PO TABS: ORAL_TABLET | ORAL | 0 refills | Status: DC

## 2022-01-07 ENCOUNTER — Ambulatory Visit: Payer: Medicare HMO | Admitting: Nurse Practitioner

## 2022-01-08 ENCOUNTER — Ambulatory Visit (INDEPENDENT_AMBULATORY_CARE_PROVIDER_SITE_OTHER): Payer: Medicare HMO | Admitting: Nurse Practitioner

## 2022-01-08 ENCOUNTER — Encounter: Payer: Self-pay | Admitting: Nurse Practitioner

## 2022-01-08 VITALS — BP 119/68 | HR 104 | Temp 97.3°F | Ht 70.0 in | Wt 195.0 lb

## 2022-01-08 DIAGNOSIS — F250 Schizoaffective disorder, bipolar type: Secondary | ICD-10-CM | POA: Diagnosis not present

## 2022-01-08 DIAGNOSIS — I10 Essential (primary) hypertension: Secondary | ICD-10-CM

## 2022-01-08 DIAGNOSIS — R69 Illness, unspecified: Secondary | ICD-10-CM | POA: Diagnosis not present

## 2022-01-08 MED ORDER — AMLODIPINE BESYLATE 10 MG PO TABS: ORAL_TABLET | ORAL | 0 refills | Status: DC

## 2022-01-08 NOTE — Patient Instructions (Signed)
You were seen today in the [facility] for follow up visit. Labs were collected, results will be available via MyChart or, if abnormal, you will be contacted by clinic staff. You were prescribed medications, please take as directed. Please follow up in 6 mths for wellness visit  

## 2022-01-08 NOTE — Progress Notes (Signed)
Omar Rogers, [place]  [address] Phone:  [phone]   Fax:  [phone] Subjective:   Patient ID: Omar Rogers, male    DOB: [birth year], 71 y.o.   MRN: [id number]  Chief Complaint  Patient presents with   Follow-up    Pt is here for 6 month's BP follow up visit.   HPI Omar Rogers 71 y.o. 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 To the [facility] for 6  mths follow up visit.  Hypertension: Patient here for follow-up of elevated blood pressure. He is exercising and is adherent to low salt diet.  Cardiac symptoms none. Patient denies chest pain, dyspnea, lower extremity edema, and orthopnea.  Cardiovascular risk factors: advanced age (older than 32 for men, 31 for women), hypertension, and male gender. Use of agents associated with hypertension: none. History of target organ damage: none. Currently goes to the gym intermittently during the week. Compliant with all medications. Denies any other concerns today.   Denies any fatigue, chest pain, shortness of breath, HA or dizziness. Denies any blurred vision, numbness or tingling.    Past Medical History:  Diagnosis Date   Bipolar disorder ([place])    DVT (deep venous thrombosis) ([place]) 06/29/2013   extensive left iliofemoral popliteal DVT/notes (06/29/2013)   Hypothyroidism 02/2019   Increased thyroid stimulating hormone (TSH) level 12/2018   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   Vitamin B 12 deficiency 12/2018   Vitamin D deficiency 12/2018    Past Surgical History:  Procedure Laterality Date   ANKLE FRACTURE SURGERY Right ~ 2012    Family History  Problem Relation Age of Onset   Hypertension Mother    Cancer Mother    Hypertension  Father     Social History   Socioeconomic History   Marital status: Single    Spouse name: Not on file   Number of children: Not on file   Years of education: Not on file   Highest education level: Not on file  Occupational History   Not on file  Tobacco Use   Smoking status: Former    Packs/day: 0.50    Years: 5.00    Total pack years: 2.50    Types: Cigarettes   Smokeless tobacco: Never   Tobacco comments:    06/29/2013 "quit smoking ~ 2 yr ago"  Vaping Use   Vaping Use: Never used  Substance and Sexual Activity   Alcohol use: Yes    Alcohol/week: 42.0 standard drinks of alcohol    Types: 42 Cans of beer per week    Comment: occ   Drug use: No   Sexual activity: Not Currently  Other Topics Concern   Not on file  Social History Narrative   Not on file   Social Determinants of Health   Financial Resource Strain: Low Risk  (01/09/2021)   Overall Financial Resource Strain (CARDIA)    Difficulty of Paying Living Expenses: Not hard at all  Food Insecurity: No Food Insecurity (01/09/2021)   Hunger Vital Sign    Worried About Running Out of Food in the Last Year: Never true    Ran Out of Food in the Last Year: Never true  Transportation Needs: No Transportation Needs (01/09/2021)   PRAPARE - Transportation    Lack of Transportation (  Medical): No    Lack of Transportation (Non-Medical): No  Physical Activity: Inactive (01/09/2021)   Exercise Vital Sign    Days of Exercise per Week: 0 days    Minutes of Exercise per Session: 0 min  Stress: No Stress Concern Present (01/09/2021)   [place]    Feeling of Stress : Not at all  Social Connections: Socially Isolated (01/09/2021)   Social Connection and Isolation Panel [NHANES]    Frequency of Communication with Friends and Family: Twice a week    Frequency of Social Gatherings with Friends and Family: Once a week    Attends Religious Services: Never    Building surveyor or Organizations: No    Attends Archivist Meetings: Never    Marital Status: Divorced  Human resources officer Violence: Not At Risk (01/09/2021)   Humiliation, Afraid, Rape, and Kick questionnaire    Fear of Current or Ex-Partner: No    Emotionally Abused: No    Physically Abused: No    Sexually Abused: No    Outpatient Medications Prior to Visit  Medication Sig Dispense Refill   atorvastatin (LIPITOR) 20 MG tablet Take 1 tablet (20 mg total) by mouth daily. 90 tablet 3   levothyroxine (SYNTHROID) 125 MCG tablet Take 1 tablet (125 mcg total) by mouth daily. 90 tablet 3   Multiple Vitamins-Minerals (MULTIVITAMIN WITH MINERALS) tablet Take 1 tablet by mouth daily.     QUEtiapine (SEROQUEL) 100 MG tablet Take 100 mg by mouth at bedtime.     amLODipine (NORVASC) 10 MG tablet TAKE 1 TABLET(10 MG) BY MOUTH DAILY 90 tablet 0   No facility-administered medications prior to visit.    Allergies  Allergen Reactions   Chlorpromazine Other (See Comments)    unknown reaction- reported from previous hospital records   Haloperidol Other (See Comments)    Unknown reaction-reported via hospital records    Review of Systems  Constitutional: Negative.  Negative for chills, fever and malaise/fatigue.  Respiratory:  Negative for cough and shortness of breath.   Cardiovascular:  Negative for chest pain, palpitations and leg swelling.  Gastrointestinal:  Negative for abdominal pain, blood in stool, constipation, diarrhea, nausea and vomiting.  Musculoskeletal: Negative.   Skin: Negative.   Neurological: Negative.   Psychiatric/Behavioral:  Negative for depression. The patient is not nervous/anxious.   All other systems reviewed and are negative.      Objective:    Physical Exam Vitals reviewed.  Constitutional:      General: He is not in acute distress.    Appearance: Normal appearance. He is normal weight.  HENT:     Head: Normocephalic.  Neck:     Vascular: No  carotid bruit.  Cardiovascular:     Rate and Rhythm: Normal rate and regular rhythm.     Pulses: Normal pulses.     Heart sounds: Normal heart sounds.     Comments: No obvious peripheral edema Pulmonary:     Effort: Pulmonary effort is normal.     Breath sounds: Normal breath sounds.  Musculoskeletal:     Cervical back: Normal range of motion and neck supple. No rigidity or tenderness.  Lymphadenopathy:     Cervical: No cervical adenopathy.  Skin:    General: Skin is warm and dry.     Capillary Refill: Capillary refill takes less than 2 seconds.  Neurological:     General: No focal deficit present.     Mental Status: He  is alert and oriented to person, place, and time.  Psychiatric:        Mood and Affect: Mood normal.        Behavior: Behavior normal.        Thought Content: Thought content normal.        Judgment: Judgment normal.     BP 119/68 (BP Location: Left Arm, Patient Position: Sitting, Cuff Size: Normal)   Pulse (!) 104   Temp (!) 97.3 F (36.3 C)   Ht 5' 10"  (1.778 m)   Wt 195 lb (88.5 kg)   SpO2 97%   BMI 27.98 kg/m  Wt Readings from Last 3 Encounters:  01/08/22 195 lb (88.5 kg)  07/09/21 194 lb 9.6 oz (88.3 kg)  01/09/21 189 lb (85.7 kg)    Immunization History  Administered Date(s) Administered   PFIZER(Purple Top)SARS-COV-2 Vaccination 09/30/2019, 10/27/2019, 06/12/2020    Diabetic Foot Exam - Simple   No data filed     Lab Results  Component Value Date   TSH 1.380 07/09/2021   Lab Results  Component Value Date   WBC 8.2 01/08/2022   HGB 15.6 01/08/2022   HCT 43.4 01/08/2022   MCV 101 (H) 01/08/2022   PLT 186 01/08/2022   Lab Results  Component Value Date   NA 141 01/08/2022   K 4.1 01/08/2022   CO2 21 01/08/2022   GLUCOSE 84 01/08/2022   BUN 14 01/08/2022   CREATININE 0.97 01/08/2022   BILITOT 0.7 01/08/2022   ALKPHOS 86 01/08/2022   AST 149 (H) 01/08/2022   ALT 117 (H) 01/08/2022   PROT 7.1 01/08/2022   ALBUMIN 4.7  01/08/2022   CALCIUM 9.7 01/08/2022   ANIONGAP 7 06/23/2017   EGFR 85 01/08/2022   Lab Results  Component Value Date   CHOL 167 01/08/2022   CHOL 190 07/09/2021   CHOL 148 01/05/2021   Lab Results  Component Value Date   HDL 57 01/08/2022   HDL 56 07/09/2021   HDL 42 01/05/2021   Lab Results  Component Value Date   LDLCALC 83 01/08/2022   LDLCALC 109 (H) 07/09/2021   LDLCALC 79 01/05/2021   Lab Results  Component Value Date   TRIG 160 (H) 01/08/2022   TRIG 142 07/09/2021   TRIG 155 (H) 01/05/2021   Lab Results  Component Value Date   CHOLHDL 2.9 01/08/2022   CHOLHDL 3.4 07/09/2021   CHOLHDL 3.5 01/05/2021   Lab Results  Component Value Date   HGBA1C 5.3 01/05/2021   HGBA1C 5.5 01/05/2020   HGBA1C 5.5 01/05/2020   HGBA1C 5.5 (A) 01/05/2020   HGBA1C 5.5 01/05/2020       Assessment & Plan:   Problem List Items Addressed This Visit       Cardiovascular and Mediastinum   Essential hypertension, benign - Primary   Relevant Medications   amLODipine (NORVASC) 10 MG tablet, refilled without change   Other Relevant Orders   CBC with Differential/Platelet (Completed)   Comprehensive metabolic panel (Completed)   Lipid panel (Completed) Encouraged continued diet and exercise efforts  Encouraged continued compliance with medication   Follow up in 6 mths for wellness visit, sooner as needed     I am having Irena Reichmann maintain his multivitamin with minerals, QUEtiapine, levothyroxine, atorvastatin, and amLODipine.  Meds ordered this encounter  Medications   amLODipine (NORVASC) 10 MG tablet    Sig: TAKE 1 TABLET(10 MG) BY MOUTH DAILY    Dispense:  90 tablet  Refill:  0     Teena Dunk, NP

## 2022-01-09 LAB — CBC WITH DIFFERENTIAL/PLATELET
Basophils Absolute: 0 10*3/uL (ref 0.0–0.2)
Basos: 1 %
EOS (ABSOLUTE): 0.2 10*3/uL (ref 0.0–0.4)
Eos: 3 %
Hematocrit: 43.4 % (ref 37.5–51.0)
Hemoglobin: 15.6 g/dL (ref 13.0–17.7)
Immature Grans (Abs): 0 10*3/uL (ref 0.0–0.1)
Immature Granulocytes: 0 %
Lymphocytes Absolute: 1 10*3/uL (ref 0.7–3.1)
Lymphs: 12 %
MCH: 36.2 pg — ABNORMAL HIGH (ref 26.6–33.0)
MCHC: 35.9 g/dL — ABNORMAL HIGH (ref 31.5–35.7)
MCV: 101 fL — ABNORMAL HIGH (ref 79–97)
Monocytes Absolute: 0.7 10*3/uL (ref 0.1–0.9)
Monocytes: 9 %
Neutrophils Absolute: 6.3 10*3/uL (ref 1.4–7.0)
Neutrophils: 75 %
Platelets: 186 10*3/uL (ref 150–450)
RBC: 4.31 x10E6/uL (ref 4.14–5.80)
RDW: 12.9 % (ref 11.6–15.4)
WBC: 8.2 10*3/uL (ref 3.4–10.8)

## 2022-01-09 LAB — COMPREHENSIVE METABOLIC PANEL
ALT: 117 IU/L — ABNORMAL HIGH (ref 0–44)
AST: 149 IU/L — ABNORMAL HIGH (ref 0–40)
Albumin/Globulin Ratio: 2 (ref 1.2–2.2)
Albumin: 4.7 g/dL (ref 3.8–4.8)
Alkaline Phosphatase: 86 IU/L (ref 44–121)
BUN/Creatinine Ratio: 14 (ref 10–24)
BUN: 14 mg/dL (ref 8–27)
Bilirubin Total: 0.7 mg/dL (ref 0.0–1.2)
CO2: 21 mmol/L (ref 20–29)
Calcium: 9.7 mg/dL (ref 8.6–10.2)
Chloride: 102 mmol/L (ref 96–106)
Creatinine, Ser: 0.97 mg/dL (ref 0.76–1.27)
Globulin, Total: 2.4 g/dL (ref 1.5–4.5)
Glucose: 84 mg/dL (ref 70–99)
Potassium: 4.1 mmol/L (ref 3.5–5.2)
Sodium: 141 mmol/L (ref 134–144)
Total Protein: 7.1 g/dL (ref 6.0–8.5)
eGFR: 85 mL/min/{1.73_m2} (ref >59)

## 2022-01-09 LAB — LIPID PANEL
Chol/HDL Ratio: 2.9 ratio (ref 0.0–5.0)
Cholesterol, Total: 167 mg/dL (ref 100–199)
HDL: 57 mg/dL (ref >39)
LDL Chol Calc (NIH): 83 mg/dL (ref 0–99)
Triglycerides: 160 mg/dL — ABNORMAL HIGH (ref 0–149)
VLDL Cholesterol Cal: 27 mg/dL (ref 5–40)

## 2022-01-15 ENCOUNTER — Ambulatory Visit (INDEPENDENT_AMBULATORY_CARE_PROVIDER_SITE_OTHER): Payer: Medicare HMO | Admitting: Nurse Practitioner

## 2022-01-15 DIAGNOSIS — Z0000 Encounter for general adult medical examination without abnormal findings: Secondary | ICD-10-CM | POA: Diagnosis not present

## 2022-01-15 NOTE — Progress Notes (Signed)
Subjective:   Omar Rogers is a 71 y.o. male who presents for an Initial Medicare Annual Wellness Visit.   I connected with  Omar Rogers on 01/15/22 by a  telephone  enabled telemedicine application and verified that I am speaking with the correct person using two identifiers.  Patient Location: Home  Provider Location: Office/Clinic  I discussed the limitations of evaluation and management by telemedicine. The patient expressed understanding and agreed to proceed.    Review of Systems           Objective:    There were no vitals filed for this visit. There is no height or weight on file to calculate BMI.     06/23/2017    1:52 PM 06/29/2013    5:06 PM  Advanced Directives  Does Patient Have a Medical Advance Directive? No Patient does not have advance directive;Patient would not like information    Current Medications (verified) Outpatient Encounter Medications as of 01/15/2022  Medication Sig   amLODipine (NORVASC) 10 MG tablet TAKE 1 TABLET(10 MG) BY MOUTH DAILY   atorvastatin (LIPITOR) 20 MG tablet Take 1 tablet (20 mg total) by mouth daily.   levothyroxine (SYNTHROID) 125 MCG tablet Take 1 tablet (125 mcg total) by mouth daily.   Multiple Vitamins-Minerals (MULTIVITAMIN WITH MINERALS) tablet Take 1 tablet by mouth daily.   QUEtiapine (SEROQUEL) 100 MG tablet Take 100 mg by mouth at bedtime.   No facility-administered encounter medications on file as of 01/15/2022.    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50    Types: Cigarettes   Smokeless tobacco: Never   Tobacco comments:    06/29/2013 "quit smoking ~ 2 yr ago"  Vaping Use   Vaping Use: Never used  Substance and Sexual Activity   Alcohol use: Yes    Alcohol/week: 42.0 standard drinks of alcohol    Types: 42 Cans of beer per week    Comment: occ   Drug use: No   Sexual activity: Not Currently  Other Topics Concern   Not on file  Social History Narrative   Not on file   Social Determinants of Health   Financial Resource Strain: Low Risk  (01/09/2021)   Overall Financial Resource Strain (CARDIA)    Difficulty of Paying Living Expenses: Not hard at all  Food Insecurity: No Food Insecurity (01/09/2021)   Hunger Vital Sign    Worried About Programme researcher, broadcasting/film/video in  the Last Year: Never true    [place] in the Last Year: Never true  Transportation Needs: No Transportation Needs (01/09/2021)   PRAPARE - Hydrologist (Medical): No    Lack of Transportation (Non-Medical): No  Physical Activity: Inactive (01/09/2021)   Exercise Vital Sign    Days of Exercise per Week: 0 days    Minutes of Exercise per Session: 0 min  Stress: No Stress Concern Present (01/09/2021)   [place]    Feeling of Stress : Not at all  Social Connections: Socially Isolated (01/09/2021)   Social Connection and Isolation Panel [NHANES]    Frequency of Communication with Friends and  Family: Twice a week    Frequency of Social Gatherings with Friends and Family: Once a week    Attends Religious Services: Never    Marine scientist or Organizations: No    Attends Archivist Meetings: Never    Marital Status: Divorced    Tobacco Counseling Counseling given: Not Answered Tobacco comments: 06/29/2013 "quit smoking ~ 2 yr ago"   Clinical Intake:                 Diabetic?Patient is not a Diabetic         Activities of Daily Living     No data to display           Patient Care Team: Fenton Foy, NP as PCP - General (Adult Health Nurse Practitioner)  Indicate any recent Medical Services you may have received from other than [facility] providers in the past year (date may be approximate).     Assessment:   This is a routine wellness examination for Omar Rogers.  Hearing/Vision screen No results found.  Dietary issues and exercise activities discussed:     Goals Addressed   None   Depression Screen    01/08/2022   10:27 AM 07/09/2021   10:08 AM 01/09/2021    9:04 AM 07/07/2019   11:17 AM 01/05/2019   10:08 AM 10/29/2017    1:42 PM  PHQ 2/9 Scores  PHQ - 2 Score 0 0 0 0 0 1  PHQ- 9 Score 0     2    Fall Risk    01/08/2022   10:27 AM 07/09/2021   10:08 AM 01/09/2021    9:09 AM 01/05/2020   11:02 AM 07/07/2019   11:17 AM  Fall Risk   Falls in the past year? 0 0 0 0 0  Number falls in past yr: 0 0 0 0 0  Injury with Fall? 0 0 0 0 0  Risk for fall due to : No Fall Risks  No Fall Risks    Follow up Falls evaluation completed  Education provided      FALL RISK PREVENTION PERTAINING TO THE HOME:  Any stairs in or around the home? Yes  If so, are there any without handrails? Yes  Home free of loose throw rugs in walkways, pet beds, electrical cords, etc? Yes  Adequate lighting in your home to reduce risk of falls? Yes   ASSISTIVE DEVICES UTILIZED TO PREVENT FALLS:  Life alert? No  Use of a cane, walker or w/c? No  Grab  bars in the bathroom? No  Shower chair or bench in shower? No  Elevated toilet seat or a handicapped toilet? No   TIMED UP AND GO:  Was the test performed? No .  Length of time to ambulate Not performed    Cognitive Function:    01/09/2021    9:10 AM  MMSE - Mini Mental State Exam  Orientation to time 5  Orientation to Place 5  Registration 3  Attention/ Calculation 5  Recall 3  Language- name 2 objects 2  Language- repeat 1  Language- follow 3 step command 3  Language- read & follow direction 1  Write a sentence 1  Copy design 1  Total score 30        Immunizations Immunization History  Administered Date(s) Administered   PFIZER(Purple Top)SARS-COV-2 Vaccination 09/30/2019, 10/27/2019, 06/12/2020    TDAP status: Due, Education has been provided regarding the importance of this vaccine. Advised may receive this vaccine at local pharmacy or Health Dept. Aware to provide a copy of the vaccination record if obtained from local pharmacy or Health Dept. Verbalized acceptance and understanding.  Flu Vaccine status: Declined, Education has been provided regarding the importance of this vaccine but patient still declined. Advised may receive this vaccine at local pharmacy or Health Dept. Aware to provide a copy of the vaccination record if obtained from local pharmacy or Health Dept. Verbalized acceptance and understanding.  Pneumococcal vaccine status: Declined,  Education has been provided regarding the importance of this vaccine but patient still declined. Advised may receive this vaccine at local pharmacy or Health Dept. Aware to provide a copy of the vaccination record if obtained from local pharmacy or Health Dept. Verbalized acceptance and understanding.   Covid-19 vaccine status: Completed vaccines  Qualifies for Shingles Vaccine? Yes   Zostavax completed No   Shingrix Completed?: No.    Education has been provided regarding the importance of this vaccine. Patient has  been advised to call insurance company to determine out of pocket expense if they have not yet received this vaccine. Advised may also receive vaccine at local pharmacy or Health Dept. Verbalized acceptance and understanding.  Screening Tests Health Maintenance  Topic Date Due   Hepatitis C Screening  Never done   COLONOSCOPY (Pts 45-64yrs Insurance coverage will need to be confirmed)  Never done   Zoster Vaccines- Shingrix (1 of 2) Never done   COVID-19 Vaccine (4 - Pfizer series) 08/07/2020   TETANUS/TDAP  07/09/2022 (Originally 06/16/1970)   HPV VACCINES  Aged Out   Pneumonia Vaccine 63+ Years old  Discontinued   INFLUENZA VACCINE  Discontinued    Health Maintenance  Health Maintenance Due  Topic Date Due   Hepatitis C Screening  Never done   COLONOSCOPY (Pts 45-56yrs Insurance coverage will need to be confirmed)  Never done   Zoster Vaccines- Shingrix (1 of 2) Never done   COVID-19 Vaccine (4 - Pfizer series) 08/07/2020    Colorectal cancer screening: Type of screening: Colonoscopy. Completed no. Repeat every 10 years  Lung Cancer Screening: (Low Dose CT Chest recommended if Age 7-80 years, 30 pack-year currently smoking OR have quit w/in 15years.) does not qualify.   Lung Cancer Screening Referral: N/A  Additional Screening:  Hepatitis C Screening: does qualify; Completed no  Vision Screening: Recommended annual ophthalmology exams for early detection of glaucoma and other disorders of the eye. Is the patient up to date with their annual eye exam?   Patient has never seen an eye doctor Who is the provider or what is the name of the office in which the patient attends annual eye exams? N/a If pt is not established with a provider, would they like to be referred to  a provider to establish care?  Patient is already establish with a PCP .   Dental Screening: Recommended annual dental exams for proper oral hygiene  Community Resource Referral / Chronic Care Management: CRR  required this visit?  No   CCM required this visit?  No      Plan:     I have personally reviewed and noted the following in the patient's chart:   Medical and social history Use of alcohol, tobacco or illicit drugs  Current medications and supplements including opioid prescriptions. Patient is not currently taking opioid prescriptions. Functional ability and status Nutritional status Physical activity Advanced directives List of other physicians Hospitalizations, surgeries, and ER visits in previous 12 months Vitals Screenings to include cognitive, depression, and falls Referrals and appointments  In addition, I have reviewed and discussed with patient certain preventive protocols, quality metrics, and best practice recommendations. A written personalized care plan for preventive services as well as general preventive health recommendations were provided to patient.     Shirley Muscat, [place]   01/15/2022   Nurse Notes: I spent 22 minutes with patient via telephone.    Omar Rogers , Thank you for taking time to come for your Medicare Wellness Visit. I appreciate your ongoing commitment to your health goals. Please review the following plan we discussed and let me know if I can assist you in the future.   These are the goals we discussed:  Goals   None     This is a list of the screening recommended for you and due dates:  Health Maintenance  Topic Date Due   Hepatitis C Screening: USPSTF Recommendation to screen - Ages 56-79 yo.  Never done   Colon Cancer Screening  Never done   Zoster (Shingles) Vaccine (1 of 2) Never done   COVID-19 Vaccine (4 - Pfizer series) 08/07/2020   Tetanus Vaccine  07/09/2022*   HPV Vaccine  Aged Out   Pneumonia Vaccine  Discontinued   Flu Shot  Discontinued  *Topic was postponed. The date shown is not the original due date.

## 2022-01-24 ENCOUNTER — Other Ambulatory Visit: Payer: Self-pay | Admitting: Nurse Practitioner

## 2022-01-24 ENCOUNTER — Telehealth: Payer: Self-pay

## 2022-01-24 MED ORDER — LEVOTHYROXINE SODIUM 125 MCG PO TABS: 125.0000 ug | ORAL_TABLET | Freq: Every day | ORAL | 3 refills | Status: DC

## 2022-01-24 NOTE — Telephone Encounter (Signed)
Levothyroxine

## 2022-07-10 ENCOUNTER — Other Ambulatory Visit: Payer: Self-pay | Admitting: Nurse Practitioner

## 2022-07-10 ENCOUNTER — Encounter: Payer: Self-pay | Admitting: Nurse Practitioner

## 2022-07-10 ENCOUNTER — Ambulatory Visit (INDEPENDENT_AMBULATORY_CARE_PROVIDER_SITE_OTHER): Payer: Medicare Other | Admitting: Nurse Practitioner

## 2022-07-10 VITALS — BP 109/71 | HR 80 | Ht 70.0 in | Wt 195.0 lb

## 2022-07-10 DIAGNOSIS — I10 Essential (primary) hypertension: Secondary | ICD-10-CM

## 2022-07-10 DIAGNOSIS — E785 Hyperlipidemia, unspecified: Secondary | ICD-10-CM

## 2022-07-10 NOTE — Patient Instructions (Signed)
1. Essential hypertension, benign  - CBC - Comprehensive metabolic panel  Follow up:  Follow up in 6 months

## 2022-07-10 NOTE — Assessment & Plan Note (Signed)
-   CBC - Comprehensive metabolic panel   Follow up:  Follow up in 6 months 

## 2022-07-10 NOTE — Progress Notes (Signed)
@Patient  ID: , male    DOB: 03/28/1951, 71 y.o.   MRN: [id number]  Chief Complaint  Patient presents with   follow-up    6 months follow-up. Pt have no concern today.    Referring provider: [id number], NP   HPI  Barbette Merino 71 y.o. male  has a past medical history of Bipolar disorder (HCC), DVT (deep venous thrombosis) (HCC) (06/29/2013), Hypothyroidism (02/2019), Increased thyroid stimulating hormone (TSH) level (12/2018), May-Thurner syndrome (07/02/2013), Vitamin B 12 deficiency (12/2018), and Vitamin D deficiency (12/2018). To the [facility] for 6  mths follow up visit.   Hypertension: Patient here for follow-up of elevated blood pressure. He is exercising and is adherent to low salt diet.  Cardiac symptoms none. Patient denies chest pain, dyspnea, lower extremity edema, and orthopnea.  Cardiovascular risk factors: advanced age (older than 31 for men, 68 for women), hypertension, and male gender. Use of agents associated with hypertension: none. History of target organ damage: none. Currently goes to the gym intermittently during the week. Compliant with all medications. Denies any other concerns today.    Denies any fatigue, chest pain, shortness of breath, HA or dizziness. Denies any blurred vision, numbness or tingling.        Allergies  Allergen Reactions   Chlorpromazine Other (See Comments)    unknown reaction- reported from previous hospital records   Haloperidol Other (See Comments)    Unknown reaction-reported via hospital records    Immunization History  Administered Date(s) Administered   PFIZER(Purple Top)SARS-COV-2 Vaccination 09/30/2019, 10/27/2019, 06/12/2020    Past Medical History:  Diagnosis Date   Bipolar disorder (HCC)    DVT (deep venous thrombosis) (HCC) 06/29/2013   extensive left iliofemoral popliteal DVT/notes (06/29/2013)   Hypothyroidism 02/2019   Increased thyroid stimulating hormone (TSH) level 12/2018   May-Thurner  syndrome 07/02/2013   Vitamin B 12 deficiency 12/2018   Vitamin D deficiency 12/2018    Tobacco History: Social History   Tobacco Use  Smoking Status Former   Packs/day: 0.50   Years: 5.00   Total pack years: 2.50   Types: Cigarettes  Smokeless Tobacco Never  Tobacco Comments   06/29/2013 "quit smoking ~ 2 yr ago"   Counseling given: Not Answered Tobacco comments: 06/29/2013 "quit smoking ~ 2 yr ago"   Outpatient Encounter Medications as of 07/10/2022  Medication Sig   amLODipine (NORVASC) 10 MG tablet TAKE 1 TABLET(10 MG) BY MOUTH DAILY   atorvastatin (LIPITOR) 20 MG tablet Take 1 tablet (20 mg total) by mouth daily.   levothyroxine (SYNTHROID) 125 MCG tablet Take 1 tablet (125 mcg total) by mouth daily.   Multiple Vitamins-Minerals (MULTIVITAMIN WITH MINERALS) tablet Take 1 tablet by mouth daily.   QUEtiapine (SEROQUEL) 100 MG tablet Take 100 mg by mouth at bedtime.   No facility-administered encounter medications on file as of 07/10/2022.     Review of Systems  Review of Systems  Constitutional: Negative.   HENT: Negative.    Cardiovascular: Negative.   Gastrointestinal: Negative.   Allergic/Immunologic: Negative.   Neurological: Negative.   Psychiatric/Behavioral: Negative.         Physical Exam  BP 109/71   Pulse 80   Ht 5\' 10"  (1.778 m)   Wt 195 lb (88.5 kg)   SpO2 97%   BMI 27.98 kg/m   Wt Readings from Last 5 Encounters:  07/10/22 195 lb (88.5 kg)  01/08/22 195 lb (88.5 kg)  07/09/21 194 lb 9.6 oz (88.3 kg)  01/09/21 189 lb (85.7 kg)  01/05/21 184 lb 9.6 oz (83.7 kg)     Physical Exam Vitals and nursing note reviewed.  Constitutional:      General: He is not in acute distress.    Appearance: He is well-developed.  Cardiovascular:     Rate and Rhythm: Normal rate and regular rhythm.  Pulmonary:     Effort: Pulmonary effort is normal.     Breath sounds: Normal breath sounds.  Skin:    General: Skin is warm and dry.  Neurological:      Mental Status: He is alert and oriented to person, place, and time.      Lab Results:  CBC    Component Value Date/Time   WBC 8.2 01/08/2022 1057   WBC 11.5 (H) 06/23/2017 1355   RBC 4.31 01/08/2022 1057   RBC 4.06 (L) 06/23/2017 1355   HGB 15.6 01/08/2022 1057   HCT 43.4 01/08/2022 1057   PLT 186 01/08/2022 1057   MCV 101 (H) 01/08/2022 1057   MCH 36.2 (H) 01/08/2022 1057   MCH 35.7 (H) 06/23/2017 1355   MCHC 35.9 (H) 01/08/2022 1057   MCHC 33.9 06/23/2017 1355   RDW 12.9 01/08/2022 1057   LYMPHSABS 1.0 01/08/2022 1057   MONOABS 0.6 09/13/2013 0912   EOSABS 0.2 01/08/2022 1057   BASOSABS 0.0 01/08/2022 1057    BMET    Component Value Date/Time   NA 141 01/08/2022 1057   K 4.1 01/08/2022 1057   CL 102 01/08/2022 1057   CO2 21 01/08/2022 1057   GLUCOSE 84 01/08/2022 1057   GLUCOSE 103 (H) 06/23/2017 1355   BUN 14 01/08/2022 1057   CREATININE 0.97 01/08/2022 1057   CREATININE 0.98 09/13/2013 0912   CALCIUM 9.7 01/08/2022 1057   GFRNONAA 72 02/07/2020 1100   GFRNONAA 83 09/13/2013 0912   GFRAA 83 02/07/2020 1100   GFRAA >89 09/13/2013 0912      Assessment & Plan:   Essential hypertension, benign - CBC - Comprehensive metabolic panel  Follow up:  Follow up in 6 months     Ivonne Andrew, NP 07/10/2022

## 2022-07-11 ENCOUNTER — Other Ambulatory Visit: Payer: Self-pay | Admitting: Nurse Practitioner

## 2022-07-11 DIAGNOSIS — R748 Abnormal levels of other serum enzymes: Secondary | ICD-10-CM

## 2022-07-11 LAB — COMPREHENSIVE METABOLIC PANEL
ALT: 158 IU/L — ABNORMAL HIGH (ref 0–44)
AST: 236 IU/L — ABNORMAL HIGH (ref 0–40)
Albumin/Globulin Ratio: 1.6 (ref 1.2–2.2)
Albumin: 4.4 g/dL (ref 3.9–4.9)
Alkaline Phosphatase: 103 IU/L (ref 44–121)
BUN/Creatinine Ratio: 10 (ref 10–24)
BUN: 10 mg/dL (ref 8–27)
Bilirubin Total: 1.2 mg/dL (ref 0.0–1.2)
CO2: 24 mmol/L (ref 20–29)
Calcium: 9.4 mg/dL (ref 8.6–10.2)
Chloride: 101 mmol/L (ref 96–106)
Creatinine, Ser: 0.97 mg/dL (ref 0.76–1.27)
Globulin, Total: 2.7 g/dL (ref 1.5–4.5)
Glucose: 111 mg/dL — ABNORMAL HIGH (ref 70–99)
Potassium: 3.7 mmol/L (ref 3.5–5.2)
Sodium: 142 mmol/L (ref 134–144)
Total Protein: 7.1 g/dL (ref 6.0–8.5)
eGFR: 85 mL/min/{1.73_m2} (ref >59)

## 2022-07-11 LAB — CBC
Hematocrit: 44.2 % (ref 37.5–51.0)
Hemoglobin: 15.6 g/dL (ref 13.0–17.7)
MCH: 36.5 pg — ABNORMAL HIGH (ref 26.6–33.0)
MCHC: 35.3 g/dL (ref 31.5–35.7)
MCV: 104 fL — ABNORMAL HIGH (ref 79–97)
Platelets: 181 10*3/uL (ref 150–450)
RBC: 4.27 x10E6/uL (ref 4.14–5.80)
RDW: 12.6 % (ref 11.6–15.4)
WBC: 6.6 10*3/uL (ref 3.4–10.8)

## 2022-07-16 ENCOUNTER — Other Ambulatory Visit: Payer: Self-pay | Admitting: Nurse Practitioner

## 2022-07-16 DIAGNOSIS — I10 Essential (primary) hypertension: Secondary | ICD-10-CM

## 2022-10-02 ENCOUNTER — Encounter: Payer: Self-pay | Admitting: Gastroenterology

## 2022-10-05 ENCOUNTER — Other Ambulatory Visit: Payer: Self-pay | Admitting: Nurse Practitioner

## 2022-10-05 DIAGNOSIS — E785 Hyperlipidemia, unspecified: Secondary | ICD-10-CM

## 2022-10-16 ENCOUNTER — Other Ambulatory Visit: Payer: Self-pay | Admitting: Nurse Practitioner

## 2022-10-16 DIAGNOSIS — I10 Essential (primary) hypertension: Secondary | ICD-10-CM

## 2022-11-11 ENCOUNTER — Other Ambulatory Visit: Payer: Self-pay | Admitting: Nurse Practitioner

## 2022-11-11 DIAGNOSIS — E785 Hyperlipidemia, unspecified: Secondary | ICD-10-CM

## 2022-11-26 ENCOUNTER — Ambulatory Visit (INDEPENDENT_AMBULATORY_CARE_PROVIDER_SITE_OTHER): Payer: 59 | Admitting: Gastroenterology

## 2022-11-26 ENCOUNTER — Other Ambulatory Visit (INDEPENDENT_AMBULATORY_CARE_PROVIDER_SITE_OTHER): Payer: 59

## 2022-11-26 ENCOUNTER — Encounter: Payer: Self-pay | Admitting: Gastroenterology

## 2022-11-26 VITALS — BP 104/60 | HR 88 | Ht 70.0 in | Wt 197.0 lb

## 2022-11-26 DIAGNOSIS — Z1211 Encounter for screening for malignant neoplasm of colon: Secondary | ICD-10-CM

## 2022-11-26 DIAGNOSIS — R748 Abnormal levels of other serum enzymes: Secondary | ICD-10-CM

## 2022-11-26 DIAGNOSIS — Z1212 Encounter for screening for malignant neoplasm of rectum: Secondary | ICD-10-CM

## 2022-11-26 DIAGNOSIS — F1090 Alcohol use, unspecified, uncomplicated: Secondary | ICD-10-CM

## 2022-11-26 LAB — CBC WITH DIFFERENTIAL/PLATELET
Basophils Absolute: 0 10*3/uL (ref 0.0–0.1)
Basophils Relative: 0.6 % (ref 0.0–3.0)
Eosinophils Absolute: 0.3 10*3/uL (ref 0.0–0.7)
Eosinophils Relative: 4.5 % (ref 0.0–5.0)
HCT: 46.3 % (ref 39.0–52.0)
Hemoglobin: 15.8 g/dL (ref 13.0–17.0)
Lymphocytes Relative: 19.3 % (ref 12.0–46.0)
Lymphs Abs: 1.4 10*3/uL (ref 0.7–4.0)
MCHC: 34.1 g/dL (ref 30.0–36.0)
MCV: 105.1 fl — ABNORMAL HIGH (ref 78.0–100.0)
Monocytes Absolute: 0.9 10*3/uL (ref 0.1–1.0)
Monocytes Relative: 13.2 % — ABNORMAL HIGH (ref 3.0–12.0)
Neutro Abs: 4.4 10*3/uL (ref 1.4–7.7)
Neutrophils Relative %: 62.4 % (ref 43.0–77.0)
Platelets: 220 10*3/uL (ref 150.0–400.0)
RBC: 4.4 Mil/uL (ref 4.22–5.81)
RDW: 13.4 % (ref 11.5–15.5)
WBC: 7.1 10*3/uL (ref 4.0–10.5)

## 2022-11-26 LAB — PROTIME-INR
INR: 1.1 ratio — ABNORMAL HIGH (ref 0.8–1.0)
Prothrombin Time: 11.8 s (ref 9.6–13.1)

## 2022-11-26 LAB — COMPREHENSIVE METABOLIC PANEL
ALT: 89 U/L — ABNORMAL HIGH (ref 0–53)
AST: 105 U/L — ABNORMAL HIGH (ref 0–37)
Albumin: 4.5 g/dL (ref 3.5–5.2)
Alkaline Phosphatase: 83 U/L (ref 39–117)
BUN: 11 mg/dL (ref 6–23)
CO2: 26 mEq/L (ref 19–32)
Calcium: 9.7 mg/dL (ref 8.4–10.5)
Chloride: 102 mEq/L (ref 96–112)
Creatinine, Ser: 1 mg/dL (ref 0.40–1.50)
GFR: 76.83 mL/min (ref >60.00)
Glucose, Bld: 98 mg/dL (ref 70–99)
Potassium: 3.8 mEq/L (ref 3.5–5.1)
Sodium: 137 mEq/L (ref 135–145)
Total Bilirubin: 1 mg/dL (ref 0.2–1.2)
Total Protein: 7.7 g/dL (ref 6.0–8.3)

## 2022-11-26 LAB — GAMMA GT: GGT: 158 U/L — ABNORMAL HIGH (ref 7–51)

## 2022-11-26 NOTE — Progress Notes (Signed)
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  The patient was noted to have mildly elevated liver enzymes back in July 2021, with ALT/AST in the 50s/60s.  His aminotransferases normalized a year later.  They were elevated again in June 2023, AST predominant, and were more elevated when last checked in 2023-12-19with AST 236, ALT 158, normal alk phos and bilirubin.  He was also noted to have a macrocytosis on his CBC. The patient does report a long history of heavy alcohol use.  Historically, he would drink 5-6 beers per day.  Last year, he started drinking much heavier in response to illness and subsequent death of his wife in 07/17/23 of last year.  For several months, he would drink a half a gallon of liquor over the course of about 4 days.  He has cut back on his drinking in the past few months, and is now back to his baseline of 5-6 beers per day.  He had a CT in 2018 which showed a normal-appearing liver.  He has no family history of liver disease or liver cancer. He denies any symptoms of yellowing of his eyes or skin, swelling/distention of his abdomen, problems with difficulty concentrating/confusion.  He does have occasional mild swelling of his legs, but has not noticed this recently.  He denies any chronic GI symptoms.  He has regular bowel movements, no problems with constipation or diarrhea.  No blood in the stool.  He denies any upper GI symptoms such as frequent heartburn/acid regurgitation, nausea/vomiting or dysphagia. His weight is stable.  He has no family history of GI malignancy.  He reports doing regular stool-based colon cancer screening tests in the past, but has not had one in the past couple years since he switched providers/insurance.      Component Ref Range & Units 4 mo ago (07/10/22) 10 mo  ago (01/08/22) 1 yr ago (07/09/21) 1 yr ago (01/05/21) 2 yr ago (02/07/20) 3 yr ago (01/05/19) 5 yr ago (10/29/17)  Glucose 70 - 99 mg/dL 161 High  84 87 87 R 096 High  R 99 R 87 R  BUN 8 - 27 mg/dL 10 14 15 11 13 10 8   Creatinine, Ser 0.76 - 1.27 mg/dL 0.45 4.09 8.11 9.14 7.82 0.88 0.90  eGFR >59 mL/min/1.73 85 85 81 77     BUN/Creatinine Ratio 10 - 24 10 14 15 10 12 11 9  Low   Sodium 134 - 144 mmol/L 142 141 139 141 138 138 142  Potassium 3.5 - 5.2 mmol/L 3.7 4.1 4.1 4.5 3.9 4.3 4.1  Chloride 96 - 106 mmol/L 101 102 98 102 105 101 101  CO2 20 - 29 mmol/L 24 21   19  Low  21 22  Calcium 8.6 - 10.2 mg/dL 9.4 9.7 9.8 9.8 8.9 9.6 9.8  Total Protein 6.0 - 8.5 g/dL 7.1 7.1 7.3 7.0 6.7 6.6 7.1  Albumin 3.9 - 4.9 g/dL 4.4 4.7 R 4.8 R 4.4 R 4.3 R 4.3 R 4.6 R  Globulin, Total 1.5 - 4.5 g/dL 2.7 2.4 2.5 2.6 2.4 2.3 2.5  Albumin/Globulin Ratio 1.2 - 2.2 1.6 2.0 1.9 1.7 1.8 1.9 1.8  Bilirubin Total 0.0 - 1.2 mg/dL 1.2 0.7 0.9 0.7 0.7 0.7 0.4  Alkaline Phosphatase 44 - 121 IU/L 103 86 92 93 103 R 84 R  77 R  AST 0 - 40 IU/L 236 High  149 High  75 High  27 52 High  30 29  ALT 0 - 44 IU/L 158 High  117 High    65 High  26 26   CT Renal Stone protocol Jun 23, 2017 IMPRESSION: 1. 3 mm obstructive stone within the distal right ureter with secondary mild right hydroureteronephrosis. 2. No other acute intra-abdominal or pelvic process identified. 3. 4 mm subpleural left lower lobe pulmonary nodule, indeterminate. No follow-up needed if patient is low-risk. Non-contrast chest CT can be considered in 12 months if patient is high-risk. This recommendation follows the consensus statement: Guidelines for Management of Incidental Pulmonary Nodules Detected on CT Images: From the Fleischner Society 2017; Radiology 2017; 284:228-243.  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   Vitamin B 12 deficiency 12/2018   Vitamin D deficiency 12/2018     Past Surgical History:  Procedure Laterality Date   ANKLE FRACTURE SURGERY Right ~ 2012   Family History  Problem Relation Age of Onset   Hypertension Mother    Cancer Mother    Hypertension Father    Social History   Tobacco Use   Smoking status: Former    Packs/day: 0.50    Years: 5.00    Additional pack years: 0.00    Total pack years: 2.50    Types: Cigarettes   Smokeless tobacco: Never   Tobacco comments:    06/29/2013 "quit smoking ~ 2 yr ago"  Vaping Use   Vaping Use: Never used  Substance Use Topics   Alcohol use: Yes    Alcohol/week: 42.0 standard drinks of alcohol    Types: 42 Cans of beer per week    Comment: occ   Drug use: No   Current Outpatient Medications  Medication Sig Dispense Refill   amLODipine (NORVASC) 10 MG tablet Take 1 tablet by mouth once daily 90 tablet 0   atorvastatin (LIPITOR) 20 MG tablet Take 1 tablet by mouth once daily 90 tablet 0   levothyroxine (SYNTHROID) 125 MCG tablet Take 1 tablet (125 mcg total) by mouth daily. 90 tablet 3   Multiple Vitamins-Minerals (MULTIVITAMIN WITH MINERALS) tablet Take 1 tablet by mouth daily.     QUEtiapine (SEROQUEL) 100 MG tablet Take 100 mg by mouth at bedtime.     No current facility-administered medications for this visit.   Allergies  Allergen Reactions   Chlorpromazine Other (See Comments)    unknown reaction- reported from previous hospital records   Haloperidol Other (See Comments)    Unknown reaction-reported via hospital records     Review of Systems: All systems reviewed and negative except where noted in HPI.    No results found.  Physical Exam: BP 104/60 (BP Location: Left Arm, Patient Position: Sitting, Cuff Size: Normal)   Pulse 88   Ht 5\' 10"   (1.778 m)   Wt 197 lb (89.4 kg)   BMI 28.27 kg/m  Constitutional: Pleasant,well-developed, Caucasian male in no acute distress. HEENT: Normocephalic and atraumatic. Conjunctivae are normal. No scleral icterus. Neck supple.  Cardiovascular: Normal rate, regular rhythm.  Pulmonary/chest: Effort normal and breath sounds normal. No wheezing, rales or rhonchi.  Mild gynecomastia Abdominal: Soft, nondistended, nontender. Bowel sounds  active throughout. There are no masses palpable. No hepatomegaly. Extremities: no edema appreciable Neurological: Alert and oriented to person place and time. Skin: Skin is warm and dry. No rashes noted.  Facial telangiectasias noted Psychiatric: Normal mood.  Affect flat.  Behavior is normal.  CBC    Component Value Date/Time   WBC 6.6 07/10/2022 1038   WBC 11.5 (H) 06/23/2017 1355   RBC 4.27 07/10/2022 1038   RBC 4.06 (L) 06/23/2017 1355   HGB 15.6 07/10/2022 1038   HCT 44.2 07/10/2022 1038   PLT 181 07/10/2022 1038   MCV 104 (H) 07/10/2022 1038   MCH 36.5 (H) 07/10/2022 1038   MCH 35.7 (H) 06/23/2017 1355   MCHC 35.3 07/10/2022 1038   MCHC 33.9 06/23/2017 1355   RDW 12.6 07/10/2022 1038   LYMPHSABS 1.0 01/08/2022 1057   MONOABS 0.6 09/13/2013 0912   EOSABS 0.2 01/08/2022 1057   BASOSABS 0.0 01/08/2022 1057    CMP     Component Value Date/Time   NA 142 07/10/2022 1038   K 3.7 07/10/2022 1038   CL 101 07/10/2022 1038   CO2 24 07/10/2022 1038   GLUCOSE 111 (H) 07/10/2022 1038   GLUCOSE 103 (H) 06/23/2017 1355   BUN 10 07/10/2022 1038   CREATININE 0.97 07/10/2022 1038   CREATININE 0.98 09/13/2013 0912   CALCIUM 9.4 07/10/2022 1038   PROT 7.1 07/10/2022 1038   ALBUMIN 4.4 07/10/2022 1038   AST 236 (H) 07/10/2022 1038   ALT 158 (H) 07/10/2022 1038   ALKPHOS 103 07/10/2022 1038   BILITOT 1.2 07/10/2022 1038   GFRNONAA 72 02/07/2020 1100   GFRNONAA 83 09/13/2013 0912   GFRAA 83 02/07/2020 1100   GFRAA >89 09/13/2013 0912    Fibrosis 4  Score = 7.16 (High risk)        Interpretation for patients with NAFLD          <1.30       -  F0-F1 (Low risk)          1.30-2.67 -  Indeterminate           >2.67      -  F3-F4 (High risk)      Validated for ages 3-65    ASSESSMENT AND PLAN:  72 year old male with history of heavy alcohol use, noted to have elevated liver enzymes, AST predominant and June and again in December of last year.  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  We discussed what alcohol-induced liver disease is in the spectrum of disease ranging from normal liver to decompensated cirrhosis.  We discussed how the liver has the ability to heal and regenerate itself up to a point, but when there is extensive scarring and cirrhosis, the damage is permanent. I encouraged the patient for cutting back on his alcohol consumption, but informed him his current levels of consumption are probably still causing liver damage.  He has a very high fib 4 score based on his last labs.  We will get an ultrasound and elastography, and if there is any significant scarring/stiffness, I would recommend he stop drinking completely.  Although his liver enzyme elevation is almost certainly due to alcohol, we will exclude other potential causes of chronic liver disease to include viral, autoimmune and genetic/metabolic. Will repeat CBC to see if macrocytosis still persistent, repeat CMP and get baseline INR and GGT. We discussed colon cancer screening, but the patient was not interested in pursuing a colonoscopy.  We will order a Cologuard for him.  Elevated liver enzymes, alcohol abuse -Rule out viral/autoimmune/genetic causes of liver disease - Ultrasound/elastography - Repeat CBC, CMP; obtain baseline INR, GGT - Counseled patient extensively on alcohol use and the effects on the liver, risks of cirrhosis; offered patient referrals for alcohol cessation programs or for medical therapy to help reduce  cravings  Colon cancer screening - Cologuard, patient declined colonoscopy  Yamilex Borgwardt E. Tomasa Rand, MD [facility]   Ivonne Andrew, NP

## 2022-11-26 NOTE — Patient Instructions (Addendum)
_______________________________________________________  If your blood pressure at your visit was 140/90 or greater, please contact your primary care physician to follow up on this.  _______________________________________________________  If you are age 70 or older, your body mass index should be between 23-30. Your Body mass index is 28.27 kg/m. If this is out of the aforementioned range listed, please consider follow up with your Primary Care Provider.  If you are age 14 or younger, your body mass index should be between 19-25. Your Body mass index is 28.27 kg/m. If this is out of the aformentioned range listed, please consider follow up with your Primary Care Provider.   Your provider has requested that you go to the basement level for lab work before leaving today. Press "B" on the elevator. The lab is located at the first door on the left as you exit the elevator.  You have been scheduled for an abdominal ultrasound at [facility] (1st floor of hospital) on 12/05/22 at 8am. Please arrive 30 minutes prior to your appointment for registration. Make certain not to have anything to eat or drink 8 hours prior to your appointment. Should you need to reschedule your appointment, please contact radiology at [phone]. This test typically takes about 30 minutes to perform.  Due to recent changes in healthcare laws, you may see the results of your imaging and laboratory studies on MyChart before your provider has had a chance to review them.  We understand that in some cases there may be results that are confusing or concerning to you. Not all laboratory results come back in the same time frame and the provider may be waiting for multiple results in order to interpret others.  Please give [place] 48 hours in order for your provider to thoroughly review all the results before contacting the office for clarification of your results.     ________________________________________________________  The [facility] providers would like to encourage you to use [facility] to communicate with providers for non-urgent requests or questions.  Due to long hold times on the telephone, sending your provider a message by [facility] may be a faster and more efficient way to get a response.  Please allow 48 business hours for a response.  Please remember that this is for non-urgent requests.   It was a pleasure to see you today!  Thank you for trusting me with your gastrointestinal care!    Scott E.Tomasa Rand, MD

## 2022-11-27 LAB — IGG: IgG (Immunoglobin G), Serum: 1062 mg/dL (ref 600–1540)

## 2022-11-27 LAB — ALPHA-1-ANTITRYPSIN: A-1 Antitrypsin, Ser: 156 mg/dL (ref 83–199)

## 2022-11-27 LAB — HEPATITIS A ANTIBODY, TOTAL: Hepatitis A AB,Total: NONREACTIVE

## 2022-11-29 LAB — ANTI-SMOOTH MUSCLE ANTIBODY, IGG: Actin (Smooth Muscle) Antibody (IGG): 21 U — ABNORMAL HIGH (ref <20)

## 2022-11-29 LAB — HEPATITIS B SURFACE ANTIBODY,QUALITATIVE: Hep B S Ab: NONREACTIVE

## 2022-12-02 LAB — MITOCHONDRIAL ANTIBODIES: Mitochondrial M2 Ab, IgG: <=20 U (ref <=20.0)

## 2022-12-02 LAB — HEPATITIS B SURFACE ANTIGEN: Hepatitis B Surface Ag: NONREACTIVE

## 2022-12-02 LAB — CERULOPLASMIN: Ceruloplasmin: 28 mg/dL (ref 18–36)

## 2022-12-02 LAB — HEPATITIS C ANTIBODY: Hepatitis C Ab: NONREACTIVE

## 2022-12-04 NOTE — Progress Notes (Signed)
Omar Rogers,  Your labs showed ongoing evidence of liver damage secondary to alcohol.  The work up for other causes of liver disease was unremarkable.  Although the antibody associated with autoimmune hepatitis (anti-smooth muscle antibody) was very slightly elevated, the very low level makes it unlikely that this is the cause of your elevated liver enzymes.  We could consider a liver biopsy to further evaluate for this, but I would not recommend it at this time.  The most important thing to improve your liver health would be to stop drinking.  Your elastography will provide further information on the severity of your liver disease.  You do not have immunity to hepatitis A or B.  It is recommended patients with liver disease be vaccinated against the viruses.  We can provide these vaccines for you at our office.

## 2022-12-05 ENCOUNTER — Ambulatory Visit (HOSPITAL_COMMUNITY)
Admission: RE | Admit: 2022-12-05 | Discharge: 2022-12-05 | Disposition: A | Discharge status: Home / Self Care | Payer: 59 | Source: Ambulatory Visit | Attending: Gastroenterology | Admitting: Gastroenterology

## 2022-12-05 DIAGNOSIS — Z1212 Encounter for screening for malignant neoplasm of rectum: Secondary | ICD-10-CM | POA: Insufficient documentation

## 2022-12-05 DIAGNOSIS — R748 Abnormal levels of other serum enzymes: Secondary | ICD-10-CM | POA: Insufficient documentation

## 2022-12-05 DIAGNOSIS — Z1211 Encounter for screening for malignant neoplasm of colon: Secondary | ICD-10-CM | POA: Insufficient documentation

## 2022-12-08 LAB — COLOGUARD: COLOGUARD: NEGATIVE

## 2022-12-09 NOTE — Progress Notes (Signed)
Mr. Sunada,  Your Cologuard test was negative.  I recommend repeat colon cancer screening with another Cologuard or a colonoscopy in 3 years.

## 2022-12-09 NOTE — Progress Notes (Signed)
Omar Rogers, [place] news, the elastography did not reveal any significant scarring of the liver.  You do have fatty change of the liver, which indicates chronic damage from alcohol use, but you are not in imminent danger of cirrhosis.  You will be at continued risk of progression of liver disease with ongoing regular use, however.  I would recommend you stop drinking completely, or at least cut back significantly to reduce your risk of cirrhosis.

## 2023-01-09 ENCOUNTER — Encounter: Payer: Self-pay | Admitting: Nurse Practitioner

## 2023-01-09 ENCOUNTER — Ambulatory Visit (INDEPENDENT_AMBULATORY_CARE_PROVIDER_SITE_OTHER): Payer: 59 | Admitting: Nurse Practitioner

## 2023-01-09 VITALS — BP 116/72 | HR 93 | Temp 97.1°F | Ht 70.0 in | Wt 198.6 lb

## 2023-01-09 DIAGNOSIS — Z13220 Encounter for screening for lipoid disorders: Secondary | ICD-10-CM

## 2023-01-09 DIAGNOSIS — I10 Essential (primary) hypertension: Secondary | ICD-10-CM | POA: Diagnosis not present

## 2023-01-09 NOTE — Patient Instructions (Signed)
1. Lipid screening  - Lipid Panel  2. Essential hypertension, benign  - CBC - Basic Metabolic Panel  Follow up:  Follow up in 6 months

## 2023-01-09 NOTE — Assessment & Plan Note (Signed)
-   Lipid Panel  2. Essential hypertension, benign  - CBC - Basic Metabolic Panel  Follow up:  Follow up in 6 months

## 2023-01-09 NOTE — Progress Notes (Signed)
@Patient  ID: Omar Rogers, male    DOB: 28-Jan-1951, 72 y.o.   MRN: [id number]  Chief Complaint  Patient presents with   Follow-up    Referring provider: Ivonne Andrew, NP   HPI  Omar Rogers 72 y.o. male  has a past medical history of Bipolar disorder [facility]), DVT (deep venous thrombosis) (HCC) (06/29/2013), Hypothyroidism (02/2019), Increased thyroid stimulating hormone (TSH) level (12/2018), May-Thurner syndrome (07/02/2013), Vitamin B 12 deficiency (12/2018), and Vitamin D deficiency (12/2018). To the [facility] for 6  mths follow up visit.   Hypertension: Patient here for follow-up of elevated blood pressure. overall has been doing well. He is exercising and is adherent to low salt diet.  Cardiac symptoms none. Patient denies chest pain, dyspnea, lower extremity edema, and orthopnea.  Cardiovascular risk factors: advanced age (older than 76 for men, 18 for women), hypertension, and male gender. Use of agents associated with hypertension: none. History of target organ damage: none. Currently goes to the gym intermittently during the week. Compliant with all medications. Denies any other concerns today.    Denies any fatigue, chest pain, shortness of breath, HA or dizziness. Denies any blurred vision, numbness or tingling.     Allergies  Allergen Reactions   Chlorpromazine Other (See Comments)    unknown reaction- reported from previous hospital records   Haloperidol Other (See Comments)    Unknown reaction-reported via hospital records    Immunization History  Administered Date(s) Administered   PFIZER(Purple Top)SARS-COV-2 Vaccination 09/30/2019, 10/27/2019, 06/12/2020    Past Medical History:  Diagnosis Date   Bipolar disorder (HCC)    DVT (deep venous thrombosis) (HCC) 06/29/2013   extensive left iliofemoral popliteal DVT/notes (06/29/2013)   HLD (hyperlipidemia)    HTN (hypertension)    Hypothyroidism 02/2019   Increased thyroid stimulating hormone (TSH) level  12/2018   Kidney stones    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   Vitamin B 12 deficiency 12/2018   Vitamin D deficiency 12/2018    Tobacco History: Social History   Tobacco Use  Smoking Status Former   Packs/day: 0.50   Years: 5.00   Additional pack years: 0.00   Total pack years: 2.50   Types: Cigarettes  Smokeless Tobacco Never  Tobacco Comments   06/29/2013 "quit smoking ~ 2 yr ago"   Counseling given: Not Answered Tobacco comments: 06/29/2013 "quit smoking ~ 2 yr ago"   Outpatient Encounter Medications as of 01/09/2023  Medication Sig   amLODipine (NORVASC) 10 MG tablet Take 1 tablet by mouth once daily   atorvastatin (LIPITOR) 20 MG tablet Take 1 tablet by mouth once daily   levothyroxine (SYNTHROID) 125 MCG tablet Take 1 tablet (125 mcg total) by mouth daily.   Multiple Vitamins-Minerals (MULTIVITAMIN WITH MINERALS) tablet Take 1 tablet by mouth daily.   QUEtiapine (SEROQUEL) 100 MG tablet Take 100 mg by mouth at bedtime.   No facility-administered encounter medications on file as of 01/09/2023.     Review of Systems  Review of Systems  Constitutional: Negative.   HENT: Negative.    Cardiovascular: Negative.   Gastrointestinal: Negative.   Allergic/Immunologic: Negative.   Neurological: Negative.   Psychiatric/Behavioral: Negative.         Physical Exam  BP 116/72   Pulse 93   Temp (!) 97.1 F (36.2 C)   Ht 5\' 10"  (1.778 m)   Wt 198 lb 9.6 oz (90.1 kg)   SpO2 96%   BMI 28.50 kg/m   Wt Readings from Last 5 Encounters:  01/09/23 198 lb 9.6 oz (90.1 kg)  11/26/22 197 lb (89.4 kg)  07/10/22 195 lb (88.5 kg)  01/08/22 195 lb (88.5 kg)  07/09/21 194 lb 9.6 oz (88.3 kg)     Physical Exam Vitals and nursing note reviewed.  Constitutional:      General: He is not in acute distress.    Appearance: He is well-developed.  Cardiovascular:     Rate and Rhythm: Normal rate and regular rhythm.  Pulmonary:     Effort: Pulmonary effort is normal.      Breath sounds: Normal breath sounds.  Skin:    General: Skin is warm and dry.  Neurological:     Mental Status: He is alert and oriented to person, place, and time.      Lab Results:  CBC    Component Value Date/Time   WBC 7.1 11/26/2022 1124   RBC 4.40 11/26/2022 1124   HGB 15.8 11/26/2022 1124   HGB 15.6 07/10/2022 1038   HCT 46.3 11/26/2022 1124   HCT 44.2 07/10/2022 1038   PLT 220.0 11/26/2022 1124   PLT 181 07/10/2022 1038   MCV 105.1 (H) 11/26/2022 1124   MCV 104 (H) 07/10/2022 1038   MCH 36.5 (H) 07/10/2022 1038   MCH 35.7 (H) 06/23/2017 1355   MCHC 34.1 11/26/2022 1124   RDW 13.4 11/26/2022 1124   RDW 12.6 07/10/2022 1038   LYMPHSABS 1.4 11/26/2022 1124   LYMPHSABS 1.0 01/08/2022 1057   MONOABS 0.9 11/26/2022 1124   EOSABS 0.3 11/26/2022 1124   EOSABS 0.2 01/08/2022 1057   BASOSABS 0.0 11/26/2022 1124   BASOSABS 0.0 01/08/2022 1057    BMET    Component Value Date/Time   NA 137 11/26/2022 1124   NA 142 07/10/2022 1038   K 3.8 11/26/2022 1124   CL 102 11/26/2022 1124   CO2 26 11/26/2022 1124   GLUCOSE 98 11/26/2022 1124   BUN 11 11/26/2022 1124   BUN 10 07/10/2022 1038   CREATININE 1.00 11/26/2022 1124   CREATININE 0.98 09/13/2013 0912   CALCIUM 9.7 11/26/2022 1124   GFRNONAA 72 02/07/2020 1100   GFRNONAA 83 09/13/2013 0912   GFRAA 83 02/07/2020 1100   GFRAA >89 09/13/2013 0912      Assessment & Plan:   Lipid screening - Lipid Panel  2. Essential hypertension, benign  - CBC - Basic Metabolic Panel  Follow up:  Follow up in 6 months     Ivonne Andrew, NP 01/09/2023

## 2023-01-10 LAB — LIPID PANEL
Chol/HDL Ratio: 3 ratio (ref 0.0–5.0)
Cholesterol, Total: 158 mg/dL (ref 100–199)
HDL: 53 mg/dL (ref >39)
LDL Chol Calc (NIH): 86 mg/dL (ref 0–99)
Triglycerides: 103 mg/dL (ref 0–149)
VLDL Cholesterol Cal: 19 mg/dL (ref 5–40)

## 2023-01-10 LAB — BASIC METABOLIC PANEL
BUN/Creatinine Ratio: 9 — ABNORMAL LOW (ref 10–24)
BUN: 10 mg/dL (ref 8–27)
CO2: 24 mmol/L (ref 20–29)
Calcium: 9.7 mg/dL (ref 8.6–10.2)
Chloride: 102 mmol/L (ref 96–106)
Creatinine, Ser: 1.11 mg/dL (ref 0.76–1.27)
Glucose: 107 mg/dL — ABNORMAL HIGH (ref 70–99)
Potassium: 3.8 mmol/L (ref 3.5–5.2)
Sodium: 141 mmol/L (ref 134–144)
eGFR: 72 mL/min/{1.73_m2} (ref >59)

## 2023-01-10 LAB — CBC
Hematocrit: 46.2 % (ref 37.5–51.0)
Hemoglobin: 15.9 g/dL (ref 13.0–17.7)
MCH: 35.8 pg — ABNORMAL HIGH (ref 26.6–33.0)
MCHC: 34.4 g/dL (ref 31.5–35.7)
MCV: 104 fL — ABNORMAL HIGH (ref 79–97)
Platelets: 201 10*3/uL (ref 150–450)
RBC: 4.44 x10E6/uL (ref 4.14–5.80)
RDW: 13.3 % (ref 11.6–15.4)
WBC: 6.7 10*3/uL (ref 3.4–10.8)

## 2023-01-13 ENCOUNTER — Other Ambulatory Visit: Payer: Self-pay | Admitting: Nurse Practitioner

## 2023-01-13 DIAGNOSIS — I10 Essential (primary) hypertension: Secondary | ICD-10-CM

## 2023-03-13 ENCOUNTER — Ambulatory Visit (INDEPENDENT_AMBULATORY_CARE_PROVIDER_SITE_OTHER): Payer: 59

## 2023-03-13 VITALS — Ht 70.0 in | Wt 198.0 lb

## 2023-03-13 DIAGNOSIS — Z5320 Procedure and treatment not carried out because of patient's decision for unspecified reasons: Secondary | ICD-10-CM

## 2023-03-13 DIAGNOSIS — Z0000 Encounter for general adult medical examination without abnormal findings: Secondary | ICD-10-CM

## 2023-03-13 DIAGNOSIS — Z5329 Procedure and treatment not carried out because of patient's decision for other reasons: Secondary | ICD-10-CM

## 2023-03-13 NOTE — Progress Notes (Signed)
 Because this visit was a virtual/telehealth visit,  certain criteria was not obtained, such a blood pressure, CBG if patient is a diabetic, and timed get up and go. Any medications not marked as "taking" was not mentioned during the medication reconciliation part of the visit. Any vitals not documented were not able to be obtained due to this being a telehealth visit. Vitals that have been documented are verbally provided by the patient.  Patient was unable to self-report a recent blood pressure reading due to a lack of equipment at home via telehealth.  Subjective:   Omar Rogers is a 72 y.o. male who presents for Medicare Annual/Subsequent preventive examination.  Visit Complete: Virtual  I connected with  Omar Rogers on 03/13/23 by a audio enabled telemedicine application and verified that I am speaking with the correct person using two identifiers.  Patient Location: Home  Provider Location: Home Office  I discussed the limitations of evaluation and management by telemedicine. The patient expressed understanding and agreed to proceed.  Patient Medicare AWV questionnaire was completed by the patient on n/a; I have confirmed that all information answered by patient is correct and no changes since this date.  Review of Systems     Cardiac Risk Factors include: advanced age (>96men, >74 women);dyslipidemia;hypertension;male gender     Objective:    Today's Vitals   03/13/23 1033  Weight: 198 lb (89.8 kg)  Height: 5\' 10"  (1.778 m)   Body mass index is 28.41 kg/m.     03/13/2023   10:35 AM 01/15/2022    2:31 PM 06/23/2017    1:52 PM 06/29/2013    5:06 PM  Advanced Directives  Does Patient Have a Medical Advance Directive? No  No Patient does not have advance directive;Patient would not like information  Would patient like information on creating a medical advance directive? No - Patient declined        Information is confidential and restricted. Go to Review  Flowsheets to unlock data.    Current Medications (verified) Outpatient Encounter Medications as of 03/13/2023  Medication Sig   amLODipine (NORVASC) 10 MG tablet Take 1 tablet by mouth once daily   atorvastatin (LIPITOR) 20 MG tablet Take 1 tablet by mouth once daily   levothyroxine (SYNTHROID) 125 MCG tablet Take 1 tablet by mouth once daily   QUEtiapine (SEROQUEL) 100 MG tablet Take 100 mg by mouth at bedtime.   Multiple Vitamins-Minerals (MULTIVITAMIN WITH MINERALS) tablet Take 1 tablet by mouth daily.   No facility-administered encounter medications on file as of 03/13/2023.    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   Vitamin B 12 deficiency 12/2018   Vitamin D deficiency 12/2018   Past Surgical History:  Procedure Laterality Date   ANKLE FRACTURE SURGERY Right ~ 2012   Family History  Problem Relation Age of Onset   Hypertension Mother    Kidney cancer Mother    Hypertension Father    Heart disease Father    Hypertension Brother    Thyroid cancer Paternal Grandmother    Lung cancer Paternal Grandfather    Social History   Socioeconomic History  Marital status: Single    Spouse name: Not on file   Number of children: 1   Years of education: Not on file   Highest education level: Not on file  Occupational History   Not on file  Tobacco Use   Smoking status: Former    Current packs/day: 0.50    Average packs/day: 0.5 packs/day for 5.0 years (2.5 ttl pk-yrs)    Types: Cigarettes   Smokeless tobacco: Never   Tobacco comments:    06/29/2013 "quit smoking ~ 2 yr ago"  Vaping Use    Vaping status: Never Used  Substance and Sexual Activity   Alcohol use: Yes    Alcohol/week: 42.0 standard drinks of alcohol    Types: 42 Cans of beer per week    Comment: 5 per day   Drug use: No   Sexual activity: Not Currently  Other Topics Concern   Not on file  Social History Narrative   Not on file   Social Determinants of Health   Financial Resource Strain: Low Risk  (03/13/2023)   Overall Financial Resource Strain (CARDIA)    Difficulty of Paying Living Expenses: Not hard at all  Food Insecurity: No Food Insecurity (03/13/2023)   Hunger Vital Sign    Worried About Running Out of Food in the Last Year: Never true    Ran Out of Food in the Last Year: Never true  Transportation Needs: No Transportation Needs (03/13/2023)   PRAPARE - Administrator, Civil Service (Medical): No    Lack of Transportation (Non-Medical): No  Physical Activity: Sufficiently Active (03/13/2023)   Exercise Vital Sign    Days of Exercise per Week: 7 days    Minutes of Exercise per Session: 80 min  Stress: No Stress Concern Present (03/13/2023)   [employer] of Occupational Health - Occupational Stress Questionnaire    Feeling of Stress : Not at all  Social Connections: Moderately Isolated (03/13/2023)   Social Connection and Isolation Panel [NHANES]    Frequency of Communication with Friends and Family: More than three times a week    Frequency of Social Gatherings with Friends and Family: Never    Attends Religious Services: Never    Database administrator or Organizations: No    Attends Engineer, structural: More than 4 times per year    Marital Status: Widowed    Tobacco Counseling Counseling given: Yes Tobacco comments: 06/29/2013 "quit smoking ~ 2 yr ago"   Clinical Intake:  Pre-visit preparation completed: Yes  Pain : No/denies pain     BMI - recorded: 28.41 Nutritional Status: BMI 25 -29 Overweight Nutritional Risks: None Diabetes: No  How often do  you need to have someone help you when you read instructions, pamphlets, or other written materials from your doctor or pharmacy?: 1 - Never  Interpreter Needed?: No  Information entered by ::  , CMA   Activities of Daily Living    03/13/2023   10:34 AM  In your present state of health, do you have any difficulty performing the following activities:  Hearing? 0  Vision? 0  Difficulty concentrating or making decisions? 0  Walking or climbing stairs? 0  Dressing or bathing? 0  Doing errands, shopping? 0  Preparing Food and eating ? N  Using the Toilet? N  In the past six months, have you accidently leaked urine? N  Do you have problems with loss of bowel control? N  Managing your Medications? N  Managing your  Finances? N  Housekeeping or managing your Housekeeping? N    Patient Care Team: Ivonne Andrew, NP as PCP - General (Adult Health Nurse Practitioner)  Indicate any recent Medical Services you may have received from other than [facility] providers in the past year (date may be approximate).     Assessment:   This is a routine wellness examination for Olujimi.  Hearing/Vision screen Hearing Screening - Comments:: Patient denies any hearing difficulties.   Vision Screening - Comments:: Patient states he does not have an eye doctor and does not want a referral to establish with one   Dietary issues and exercise activities discussed:     Goals Addressed             This Visit's Progress    Patient Stated       "Remain active and independent"       Depression Screen    03/13/2023   10:37 AM 01/09/2023    9:44 AM 01/15/2022    2:09 PM 01/08/2022   10:27 AM 07/09/2021   10:08 AM 01/09/2021    9:04 AM 07/07/2019   11:17 AM  PHQ 2/9 Scores  PHQ - 2 Score 0    0 0 0  PHQ- 9 Score            Information is confidential and restricted. Go to Review Flowsheets to unlock data.    Fall Risk    03/13/2023   10:36 AM 01/09/2023    9:44 AM 01/15/2022     2:08 PM 01/08/2022   10:27 AM 07/09/2021   10:08 AM  Fall Risk   Falls in the past year? 0    0  Number falls in past yr: 0    0  Injury with Fall? 0    0  Risk for fall due to : No Fall Risks      Follow up Falls prevention discussed         Information is confidential and restricted. Go to Review Flowsheets to unlock data.    MEDICARE RISK AT HOME:  Medicare Risk at Home - 03/13/23 1036     Any stairs in or around the home? Yes    If so, are there any without handrails? No    Home free of loose throw rugs in walkways, pet beds, electrical cords, etc? Yes    Adequate lighting in your home to reduce risk of falls? Yes    Life alert? No    Use of a cane, walker or w/c? No    Grab bars in the bathroom? Yes    Shower chair or bench in shower? No    Elevated toilet seat or a handicapped toilet? No             TIMED UP AND GO:  Was the test performed?  No    Cognitive Function:    01/09/2021    9:10 AM  MMSE - Mini Mental State Exam  Orientation to time 5  Orientation to Place 5  Registration 3  Attention/ Calculation 5  Recall 3  Language- name 2 objects 2  Language- repeat 1  Language- follow 3 step command 3  Language- read & follow direction 1  Write a sentence 1  Copy design 1  Total score 30        03/13/2023   10:37 AM 01/15/2022    2:09 PM  6CIT Screen  What Year? 0 points   What month? 0 points  What time? 0 points   Count back from 20 0 points   Months in reverse 0 points   Repeat phrase 0 points   Total Score 0 points      Information is confidential and restricted. Go to Review Flowsheets to unlock data.    Immunizations Immunization History  Administered Date(s) Administered   PFIZER(Purple Top)SARS-COV-2 Vaccination 09/30/2019, 10/27/2019, 06/12/2020    TDAP status: Due, Education has been provided regarding the importance of this vaccine. Advised may receive this vaccine at local pharmacy or Health Dept. Aware to provide a copy of  the vaccination record if obtained from local pharmacy or Health Dept. Verbalized acceptance and understanding.  Flu Vaccine status: Due, Education has been provided regarding the importance of this vaccine. Advised may receive this vaccine at local pharmacy or Health Dept. Aware to provide a copy of the vaccination record if obtained from local pharmacy or Health Dept. Verbalized acceptance and understanding.  Pneumococcal vaccine status: Declined,  Education has been provided regarding the importance of this vaccine but patient still declined. Advised may receive this vaccine at local pharmacy or Health Dept. Aware to provide a copy of the vaccination record if obtained from local pharmacy or Health Dept. Verbalized acceptance and understanding.   Covid-19 vaccine status: Information provided on how to obtain vaccines.   Qualifies for Shingles Vaccine? Yes   Zostavax completed No   Shingrix Completed?: No.    Education has been provided regarding the importance of this vaccine. Patient has been advised to call insurance company to determine out of pocket expense if they have not yet received this vaccine. Advised may also receive vaccine at local pharmacy or Health Dept. Verbalized acceptance and understanding.  Screening Tests Health Maintenance  Topic Date Due   DTaP/Tdap/Td (1 - Tdap) Never done   Colonoscopy  Never done   Zoster Vaccines- Shingrix (1 of 2) Never done   COVID-19 Vaccine (4 - 2023-24 season) 03/29/2022   Medicare Annual Wellness (AWV)  01/16/2023   INFLUENZA VACCINE  02/27/2023   Hepatitis C Screening  Completed   HPV VACCINES  Aged Out   Pneumonia Vaccine 42+ Years old  Discontinued    Health Maintenance  Health Maintenance Due  Topic Date Due   DTaP/Tdap/Td (1 - Tdap) Never done   Colonoscopy  Never done   Zoster Vaccines- Shingrix (1 of 2) Never done   COVID-19 Vaccine (4 - 2023-24 season) 03/29/2022   Medicare Annual Wellness (AWV)  01/16/2023   INFLUENZA  VACCINE  02/27/2023    Colorectal Cancer Screening: Patient declined referral to GI for colonoscopy.   Lung Cancer Screening: (Low Dose CT Chest recommended if Age 101-80 years, 20 pack-year currently smoking OR have quit w/in 15years.) does not qualify.     Additional Screening:  Hepatitis C Screening: does not qualify; Completed 11/26/2022  Vision Screening: Recommended annual ophthalmology exams for early detection of glaucoma and other disorders of the eye. Is the patient up to date with their annual eye exam?  No  Who is the provider or what is the name of the office in which the patient attends annual eye exams? N/a If pt is not established with a provider, would they like to be referred to a provider to establish care? No .   Dental Screening: Recommended annual dental exams for proper oral hygiene  Diabetic Foot Exam: n/a  Community Resource Referral / Chronic Care Management: CRR required this visit?  No   CCM required this visit?  No     Plan:     I have personally reviewed and noted the following in the patient's chart:   Medical and social history Use of alcohol, tobacco or illicit drugs  Current medications and supplements including opioid prescriptions. Patient is not currently taking opioid prescriptions. Functional ability and status Nutritional status Physical activity Advanced directives List of other physicians Hospitalizations, surgeries, and ER visits in previous 12 months Vitals Screenings to include cognitive, depression, and falls Referrals and appointments  In addition, I have reviewed and discussed with patient certain preventive protocols, quality metrics, and best practice recommendations. A written personalized care plan for preventive services as well as general preventive health recommendations were provided to patient.     Jordan Hawks , CMA   03/13/2023   After Visit Summary: (Mail) Due to this being a telephonic visit, the after  visit summary with patients personalized plan was offered to patient via mail   Nurse Notes: Patient declined referrals for colonoscopy and ETOH abuse eval + treatment.

## 2023-03-13 NOTE — Patient Instructions (Signed)
Omar Rogers , Thank you for taking time to come for your Medicare Wellness Visit. I appreciate your ongoing commitment to your health goals. Please review the following plan we discussed and let me know if I can assist you in the future.   These are the goals we discussed:  Goals      Patient Stated     "Remain active and independent"        This is a list of the screening recommended for you and due dates:  Health Maintenance  Topic Date Due   DTaP/Tdap/Td vaccine (1 - Tdap) Never done   Colon Cancer Screening  Never done   Zoster (Shingles) Vaccine (1 of 2) Never done   COVID-19 Vaccine (4 - 2023-24 season) 03/29/2022   Flu Shot  02/27/2023   Medicare Annual Wellness Visit  03/12/2024   Hepatitis C Screening  Completed   HPV Vaccine  Aged Out   Pneumonia Vaccine  Discontinued    Advanced directives: Advance directive discussed with you today. Even though you declined this today, please call our office should you change your mind, and we can give you the proper paperwork for you to fill out. Advance care planning is a way to make decisions about medical care that fits your values in case you are ever unable to make these decisions for yourself.  Information on Advanced Care Planning can be found at [facility] of [place] Advance Health Care Directives Advance Health Care Directives ([email or web address]    Conditions/risks identified:  .Alcohol Use Disorder Alcohol use disorder is a condition in which drinking disrupts daily life. People with this condition drink too much alcohol and cannot control their drinking. Alcohol use disorder can cause serious problems with physical health. It can affect the brain, heart, and other internal organs. This disorder can raise the risk for certain cancers and cause problems with mental health, such as depression or anxiety. What are the causes? This condition is caused by drinking too much alcohol over time. Some people with this condition  drink to cope with or escape from negative life events. Others drink to relieve symptoms of physical pain or symptoms of mental illness. What increases the risk? You are more likely to develop this condition if: You have a family history of alcohol use disorder. Your culture encourages drinking to the point of becoming drunk (intoxication). You had a mood or conduct disorder in childhood. You have been abused. You are an adolescent and you: Have poor performance in school. Have poor supervision or guidance. Act on impulse and like taking risks. What are the signs or symptoms? Symptoms of this condition include: Drinking more than you want to. Trying several times without success to drink less. Spending a lot of time thinking about alcohol, getting alcohol, drinking alcohol, or recovering from drinking alcohol. Continuing to drink even when it is causing serious problems in your daily life. Drinking when it is dangerous to drink, such as before driving a car. Needing more and more alcohol to get the same effect you want (building up tolerance). Having symptoms of withdrawal when you stop drinking. Withdrawal symptoms may include: Trouble sleeping, leading to tiredness (fatigue). Mood swings of depression and anxiety. Physical symptoms, such as a fast heart rate, rapid breathing, high blood pressure (hypertension), fever, cold sweats, or nausea. Seizures. Severe confusion. Feeling or seeing things that are not there (hallucinations). Shaking movements that you cannot control (tremors). How is this diagnosed? This condition is diagnosed with an assessment.  Your health care provider may start by asking three or four questions about your drinking, or they may give you a simple test to take. This helps to get clear information from you. You may also have a physical exam or lab tests. You may be referred to a substance abuse counselor. How is this treated? With education, some people with  alcohol use disorder are able to reduce their drinking. Many with this disorder cannot change their drinking behavior on their own and need help with treatment from substance use specialists. Treatments may include: Detoxification. Detoxification involves quitting drinking with supervision and direction of health care providers. Your health care provider may prescribe medicines within the first week to help lessen withdrawal symptoms. Alcohol withdrawal can be dangerous and life-threatening. Detoxification may be provided in a home, community, or primary care setting, or in a hospital or substance use treatment facility. Counseling. This may involve motivational interviewing (MI), family therapy, or cognitive behavioral therapy (CBT). A counselor can address the things you can do to change your drinking behavior and how to maintain the changes. Talk therapy aims to: Identify your positive motivations to change. Identify and avoid the things that trigger your drinking. Help you learn how to plan your behavior change. Develop support systems that can help you sustain the change. Medicines. Medicines can help treat this disorder by: Decreasing cravings. Decreasing the positive feeling you have when you drink. Causing an uncomfortable physical reaction when you drink (aversion therapy). Support groups such as Alcoholics Anonymous (AA). These groups are led by people who have quit drinking. The groups provide emotional support, advice, and guidance. Some people with this condition benefit from a combination of treatments provided by specialized substance use treatment centers. Follow these instructions at home:  Medicines Take over-the-counter and prescription medicines only as told by your health care provider. Ask before starting any new medicines, herbs, or supplements. General instructions Ask friends and family members to support your choice to stay sober. Avoid places where alcohol is  served. Create a plan to deal with tempting situations. Attend support groups regularly. Practice hobbies or activities you enjoy. Do not drink and drive. How is this prevented? Do not drink alcohol if your health care provider tells you not to drink. If you drink alcohol: Limit how much you have to: 0-1 drink a day for women who are not pregnant. 0-2 drinks a day for men. Know how much alcohol is in your drink. In the [place] one drink equals one 12 oz bottle of beer (355 mL), one 5 oz glass of wine (148 mL), or one 1 oz glass of hard liquor (44 mL). If you have a mental health condition, seek treatment. Develop a healthy lifestyle through: Meditation or deep breathing. Exercise. Spending time in nature. Listening to music. Talking with a trusted friend or family member. If you are a teen: Do not drink alcohol. Avoid gatherings where you might be tempted to drink alcohol. Do not be afraid to say no if someone offers you alcohol. Speak up about why you do not want to drink. Set a positive example for others around you by not drinking. Build relationships with friends who do not drink. Where to find more information Substance Abuse and Mental Health Services Administration: RockToxic.pl Alcoholics Anonymous: CustomizedRugs.fi Contact a health care provider if: You cannot take your medicines as told. Your symptoms get worse or you experience symptoms of withdrawal when you stop drinking. You start drinking again (relapse) and your symptoms get worse.  Get help right away if: You have thoughts about hurting yourself or others. Get help right away if you feel like you may hurt yourself or others, or have thoughts about taking your own life. Go to your nearest emergency room or: Call 911. Call the National Suicide Prevention Lifeline at 931-424-0410 or 988. This is open 24 hours a day. Text the Crisis Text Line at 803-578-3112. Summary Alcohol use disorder is a condition in which drinking disrupts daily  life. People with this condition drink too much alcohol and cannot control their drinking. Treatment may include detoxification, counseling, medicines, and support groups. Ask friends and family members to support you. Avoid situations where alcohol is served. Get help right away if you have thoughts about hurting yourself or others. This information is not intended to replace advice given to you by your health care provider. Make sure you discuss any questions you have with your health care provider. Document Revised: 09/19/2021 Document Reviewed: 09/19/2021 Elsevier Patient Education  2024 Elsevier Inc.    Next appointment: VIRTUAL/ TELEPHONE VISIT Follow up in one year for your annual wellness visit  March 18, 2024 at 8:30 am telephone visit.    Preventive Care 69 Years and Older, Male  Preventive care refers to lifestyle choices and visits with your health care provider that can promote health and wellness. What does preventive care include? A yearly physical exam. This is also called an annual well check. Dental exams once or twice a year. Routine eye exams. Ask your health care provider how often you should have your eyes checked. Personal lifestyle choices, including: Daily care of your teeth and gums. Regular physical activity. Eating a healthy diet. Avoiding tobacco and drug use. Limiting alcohol use. Practicing safe sex. Taking low doses of aspirin every day. Taking vitamin and mineral supplements as recommended by your health care provider. What happens during an annual well check? The services and screenings done by your health care provider during your annual well check will depend on your age, overall health, lifestyle risk factors, and family history of disease. Counseling  Your health care provider may ask you questions about your: Alcohol use. Tobacco use. Drug use. Emotional well-being. Home and relationship well-being. Sexual activity. Eating  habits. History of falls. Memory and ability to understand (cognition). Work and work Astronomer. Screening  You may have the following tests or measurements: Height, weight, and BMI. Blood pressure. Lipid and cholesterol levels. These may be checked every 5 years, or more frequently if you are over 57 years old. Skin check. Lung cancer screening. You may have this screening every year starting at age 77 if you have a 30-pack-year history of smoking and currently smoke or have quit within the past 15 years. Fecal occult blood test (FOBT) of the stool. You may have this test every year starting at age 54. Flexible sigmoidoscopy or colonoscopy. You may have a sigmoidoscopy every 5 years or a colonoscopy every 10 years starting at age 74. Prostate cancer screening. Recommendations will vary depending on your family history and other risks. Hepatitis C blood test. Hepatitis B blood test. Sexually transmitted disease (STD) testing. Diabetes screening. This is done by checking your blood sugar (glucose) after you have not eaten for a while (fasting). You may have this done every 1-3 years. Abdominal aortic aneurysm (AAA) screening. You may need this if you are a current or former smoker. Osteoporosis. You may be screened starting at age 1 if you are at high risk. Talk with your  health care provider about your test results, treatment options, and if necessary, the need for more tests. Vaccines  Your health care provider may recommend certain vaccines, such as: Influenza vaccine. This is recommended every year. Tetanus, diphtheria, and acellular pertussis (Tdap, Td) vaccine. You may need a Td booster every 10 years. Zoster vaccine. You may need this after age 24. Pneumococcal 13-valent conjugate (PCV13) vaccine. One dose is recommended after age 56. Pneumococcal polysaccharide (PPSV23) vaccine. One dose is recommended after age 65. Talk to your health care provider about which screenings and  vaccines you need and how often you need them. This information is not intended to replace advice given to you by your health care provider. Make sure you discuss any questions you have with your health care provider. Document Released: 08/11/2015 Document Revised: 04/03/2016 Document Reviewed: 05/16/2015 Elsevier Interactive Patient Education  2017 [employer].  Fall Prevention in the Home Falls can cause injuries. They can happen to people of all ages. There are many things you can do to make your home safe and to help prevent falls. What can I do on the outside of my home? Regularly fix the edges of walkways and driveways and fix any cracks. Remove anything that might make you trip as you walk through a door, such as a raised step or threshold. Trim any bushes or trees on the path to your home. Use bright outdoor lighting. Clear any walking paths of anything that might make someone trip, such as rocks or tools. Regularly check to see if handrails are loose or broken. Make sure that both sides of any steps have handrails. Any raised decks and porches should have guardrails on the edges. Have any leaves, snow, or ice cleared regularly. Use sand or salt on walking paths during winter. Clean up any spills in your garage right away. This includes oil or grease spills. What can I do in the bathroom? Use night lights. Install grab bars by the toilet and in the tub and shower. Do not use towel bars as grab bars. Use non-skid mats or decals in the tub or shower. If you need to sit down in the shower, use a plastic, non-slip stool. Keep the floor dry. Clean up any water that spills on the floor as soon as it happens. Remove soap buildup in the tub or shower regularly. Attach bath mats securely with double-sided non-slip rug tape. Do not have throw rugs and other things on the floor that can make you trip. What can I do in the bedroom? Use night lights. Make sure that you have a light by your  bed that is easy to reach. Do not use any sheets or blankets that are too big for your bed. They should not hang down onto the floor. Have a firm chair that has side arms. You can use this for support while you get dressed. Do not have throw rugs and other things on the floor that can make you trip. What can I do in the kitchen? Clean up any spills right away. Avoid walking on wet floors. Keep items that you use a lot in easy-to-reach places. If you need to reach something above you, use a strong step stool that has a grab bar. Keep electrical cords out of the way. Do not use floor polish or wax that makes floors slippery. If you must use wax, use non-skid floor wax. Do not have throw rugs and other things on the floor that can make you trip. What  can I do with my stairs? Do not leave any items on the stairs. Make sure that there are handrails on both sides of the stairs and use them. Fix handrails that are broken or loose. Make sure that handrails are as long as the stairways. Check any carpeting to make sure that it is firmly attached to the stairs. Fix any carpet that is loose or worn. Avoid having throw rugs at the top or bottom of the stairs. If you do have throw rugs, attach them to the floor with carpet tape. Make sure that you have a light switch at the top of the stairs and the bottom of the stairs. If you do not have them, ask someone to add them for you. What else can I do to help prevent falls? Wear shoes that: Do not have high heels. Have rubber bottoms. Are comfortable and fit you well. Are closed at the toe. Do not wear sandals. If you use a stepladder: Make sure that it is fully opened. Do not climb a closed stepladder. Make sure that both sides of the stepladder are locked into place. Ask someone to hold it for you, if possible. Clearly mark and make sure that you can see: Any grab bars or handrails. First and last steps. Where the edge of each step is. Use tools that  help you move around (mobility aids) if they are needed. These include: Canes. Walkers. Scooters. Crutches. Turn on the lights when you go into a dark area. Replace any light bulbs as soon as they burn out. Set up your furniture so you have a clear path. Avoid moving your furniture around. If any of your floors are uneven, fix them. If there are any pets around you, be aware of where they are. Review your medicines with your doctor. Some medicines can make you feel dizzy. This can increase your chance of falling. Ask your doctor what other things that you can do to help prevent falls. This information is not intended to replace advice given to you by your health care provider. Make sure you discuss any questions you have with your health care provider. Document Released: 05/11/2009 Document Revised: 12/21/2015 Document Reviewed: 08/19/2014 Elsevier Interactive Patient Education  2017 [employer].

## 2023-04-04 ENCOUNTER — Other Ambulatory Visit: Payer: Self-pay | Admitting: Nurse Practitioner

## 2023-04-04 DIAGNOSIS — E785 Hyperlipidemia, unspecified: Secondary | ICD-10-CM

## 2023-04-09 ENCOUNTER — Other Ambulatory Visit: Payer: Self-pay | Admitting: Nurse Practitioner

## 2023-04-09 DIAGNOSIS — I10 Essential (primary) hypertension: Secondary | ICD-10-CM

## 2023-06-29 ENCOUNTER — Other Ambulatory Visit: Payer: Self-pay | Admitting: Nurse Practitioner

## 2023-06-29 DIAGNOSIS — E785 Hyperlipidemia, unspecified: Secondary | ICD-10-CM

## 2023-07-05 ENCOUNTER — Other Ambulatory Visit: Payer: Self-pay | Admitting: Nurse Practitioner

## 2023-07-05 DIAGNOSIS — I10 Essential (primary) hypertension: Secondary | ICD-10-CM

## 2023-07-11 ENCOUNTER — Ambulatory Visit (INDEPENDENT_AMBULATORY_CARE_PROVIDER_SITE_OTHER): Payer: 59 | Admitting: Nurse Practitioner

## 2023-07-11 ENCOUNTER — Encounter: Payer: Self-pay | Admitting: Nurse Practitioner

## 2023-07-11 VITALS — BP 121/70 | HR 90 | Temp 97.2°F | Wt 208.0 lb

## 2023-07-11 DIAGNOSIS — Z13220 Encounter for screening for lipoid disorders: Secondary | ICD-10-CM

## 2023-07-11 DIAGNOSIS — I10 Essential (primary) hypertension: Secondary | ICD-10-CM

## 2023-07-11 DIAGNOSIS — E039 Hypothyroidism, unspecified: Secondary | ICD-10-CM

## 2023-07-11 NOTE — Patient Instructions (Signed)
1. Lipid screening (Primary)  - Lipid Panel  2. Essential hypertension, benign  - CBC - Comprehensive metabolic panel  3. Hypothyroidism, unspecified type  - Thyroid Panel With TSH  Follow up:  Follow up in 6 months

## 2023-07-11 NOTE — Progress Notes (Signed)
Subjective   Patient ID: Omar Rogers, male    DOB: [birth year], 72 y.o.   MRN: [id number]  Chief Complaint  Patient presents with   Hypertension   Hyperlipidemia    Referring provider: Ivonne Andrew, NP  KAELUB BLITCH is a 72 y.o. 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 overall has been doing well. He is exercising and is adherent to low salt diet.  Cardiac symptoms none. Patient denies chest pain, dyspnea, lower extremity edema, and orthopnea.  Cardiovascular risk factors: advanced age (older than 70 for men, 84 for women), hypertension, and male gender. Use of agents associated with hypertension: none. History of target organ damage: none. Currently goes to the gym intermittently during the week. Compliant with all medications. Denies any other concerns today.   Hypothyroid:  Patient is currently on Synthroid.  He is compliant with medications.  Hyperlipidemia:  Patient is currently on atorvastatin he is compliant with medications.  Overall tolerating well.   Denies any fatigue, chest pain, shortness of breath, HA or dizziness. Denies any blurred vision, numbness or tingling.    Allergies  Allergen Reactions   Chlorpromazine Other (See Comments)    unknown reaction- reported from previous hospital records   Haloperidol Other (See Comments)    Unknown reaction-reported via hospital records    Immunization History  Administered Date(s) Administered   PFIZER(Purple  Top)SARS-COV-2 Vaccination 09/30/2019, 10/27/2019, 06/12/2020    Tobacco History: Social History   Tobacco Use  Smoking Status Former   Current packs/day: 0.50   Average packs/day: 0.5 packs/day for 5.0 years (2.5 ttl pk-yrs)   Types: Cigarettes  Smokeless Tobacco Never  Tobacco Comments   06/29/2013 "quit smoking ~ 2 yr ago"   Counseling given: Not Answered Tobacco comments: 06/29/2013 "quit smoking ~ 2 yr ago"   Outpatient Encounter Medications as of 07/11/2023  Medication Sig   amLODipine (NORVASC) 10 MG tablet Take 1 tablet by mouth once daily   atorvastatin (LIPITOR) 20 MG tablet Take 1 tablet by mouth once daily   levothyroxine (SYNTHROID) 125 MCG tablet Take 1 tablet by mouth once daily   Multiple Vitamins-Minerals (MULTIVITAMIN WITH MINERALS) tablet Take 1 tablet by mouth daily.   QUEtiapine (SEROQUEL) 100 MG tablet Take 100 mg by mouth at bedtime.   No facility-administered encounter medications on file as of 07/11/2023.    Review of Systems  Review of Systems  Constitutional: Negative.   HENT: Negative.    Cardiovascular: Negative.   Gastrointestinal: Negative.   Allergic/Immunologic: Negative.   Neurological: Negative.   Psychiatric/Behavioral: Negative.       Objective:   BP 121/70   Pulse 90   Temp (!) 97.2 F (36.2 C)   Wt 208 lb (94.3 kg)   SpO2 95%   BMI 29.84 kg/m   Wt Readings from Last 5 Encounters:  07/11/23 208 lb (94.3 kg)  03/13/23 198 lb (89.8 kg)  01/09/23 198 lb 9.6 oz (90.1 kg)  11/26/22 197 lb (89.4 kg)  07/10/22 195 lb (88.5 kg)  Physical Exam Vitals and nursing note reviewed.  Constitutional:      General: He is not in acute distress.    Appearance: He is well-developed.  Cardiovascular:     Rate and Rhythm: Normal rate and regular rhythm.  Pulmonary:     Effort: Pulmonary effort is normal.     Breath sounds: Normal breath sounds.  Skin:    General: Skin is warm and dry.  Neurological:     Mental Status: He is  alert and oriented to person, place, and time.       Assessment & Plan:   Lipid screening -     Lipid panel  Essential hypertension, benign -     CBC -     Comprehensive metabolic panel  Hypothyroidism, unspecified type -     Thyroid Panel With TSH     Return in about 6 months (around 01/09/2024).   Ivonne Andrew, NP 07/11/2023

## 2023-07-12 LAB — LIPID PANEL
Chol/HDL Ratio: 4.1 {ratio} (ref 0.0–5.0)
Cholesterol, Total: 185 mg/dL (ref 100–199)
HDL: 45 mg/dL (ref >39)
LDL Chol Calc (NIH): 101 mg/dL — ABNORMAL HIGH (ref 0–99)
Triglycerides: 230 mg/dL — ABNORMAL HIGH (ref 0–149)
VLDL Cholesterol Cal: 39 mg/dL (ref 5–40)

## 2023-07-12 LAB — COMPREHENSIVE METABOLIC PANEL
ALT: 81 [IU]/L — ABNORMAL HIGH (ref 0–44)
AST: 102 [IU]/L — ABNORMAL HIGH (ref 0–40)
Albumin: 4.4 g/dL (ref 3.9–4.9)
Alkaline Phosphatase: 113 [IU]/L (ref 44–121)
BUN/Creatinine Ratio: 11 (ref 10–24)
BUN: 12 mg/dL (ref 8–27)
Bilirubin Total: 0.7 mg/dL (ref 0.0–1.2)
CO2: 17 mmol/L — ABNORMAL LOW (ref 20–29)
Calcium: 9.5 mg/dL (ref 8.6–10.2)
Chloride: 100 mmol/L (ref 96–106)
Creatinine, Ser: 1.07 mg/dL (ref 0.76–1.27)
Globulin, Total: 2.7 g/dL (ref 1.5–4.5)
Glucose: 125 mg/dL — ABNORMAL HIGH (ref 70–99)
Potassium: 3.9 mmol/L (ref 3.5–5.2)
Sodium: 139 mmol/L (ref 134–144)
Total Protein: 7.1 g/dL (ref 6.0–8.5)
eGFR: 75 mL/min/{1.73_m2} (ref >59)

## 2023-07-12 LAB — THYROID PANEL WITH TSH
Free Thyroxine Index: 2.4 (ref 1.2–4.9)
T3 Uptake Ratio: 28 % (ref 24–39)
T4, Total: 8.6 ug/dL (ref 4.5–12.0)
TSH: 4.2 u[IU]/mL (ref 0.450–4.500)

## 2023-07-12 LAB — CBC
Hematocrit: 44.6 % (ref 37.5–51.0)
Hemoglobin: 14.9 g/dL (ref 13.0–17.7)
MCH: 35.5 pg — ABNORMAL HIGH (ref 26.6–33.0)
MCHC: 33.4 g/dL (ref 31.5–35.7)
MCV: 106 fL — ABNORMAL HIGH (ref 79–97)
Platelets: 188 10*3/uL (ref 150–450)
RBC: 4.2 x10E6/uL (ref 4.14–5.80)
RDW: 12.7 % (ref 11.6–15.4)
WBC: 7.7 10*3/uL (ref 3.4–10.8)

## 2023-07-14 ENCOUNTER — Telehealth: Payer: Self-pay

## 2023-07-14 NOTE — Telephone Encounter (Signed)
Done KH 

## 2023-07-14 NOTE — Telephone Encounter (Signed)
Copied from CRM [id number]. Topic: Clinical - Prescription Issue >> Jul 14, 2023  9:40 AM Prudencio Pair wrote: Reason for CRM: Ry, with [facility] pharmacy, called stating that the state requires approval from provider when a manufacturer change is needed. This is for Levothyroxin 125 mcg. Please give Ry a call back at [phone].

## 2023-07-17 NOTE — Progress Notes (Signed)
Message was left for patient to return call regarding results. For lab results. KH

## 2023-09-25 ENCOUNTER — Other Ambulatory Visit: Payer: Self-pay | Admitting: Nurse Practitioner

## 2023-09-25 DIAGNOSIS — E785 Hyperlipidemia, unspecified: Secondary | ICD-10-CM

## 2023-10-01 ENCOUNTER — Telehealth: Payer: Self-pay | Admitting: Nurse Practitioner

## 2023-10-01 NOTE — Telephone Encounter (Signed)
 Copied from CRM [id number]. Topic: General - Other >> Oct 01, 2023 11:53 AM Epimenio Foot F wrote: Reason for CRM: Patient is calling in because he says his social security check was deposited into his brother's account and his brother is deceased. Patient says he reached out to Social Security who told him to contact his provider to provider a letter or some documentation. Patient is requesting a call back.

## 2023-10-04 ENCOUNTER — Other Ambulatory Visit: Payer: Self-pay | Admitting: Nurse Practitioner

## 2023-10-04 DIAGNOSIS — I10 Essential (primary) hypertension: Secondary | ICD-10-CM

## 2023-10-06 ENCOUNTER — Telehealth: Payer: Self-pay

## 2023-10-06 NOTE — Telephone Encounter (Unsigned)
 Copied from CRM [id number]. Topic: General - Other >> Oct 03, 2023  2:00 PM Elle L wrote: Reason for CRM: The patient's brother was his payee for [employer]. Unfortunately, he has passed away and they are requiring a statement from his Physician stating he can handle his own finances. The patient's call back [phone].

## 2023-10-07 ENCOUNTER — Telehealth: Payer: Self-pay

## 2023-10-07 NOTE — Telephone Encounter (Signed)
 Copied from CRM [id number]. Topic: General - Call Back - No Documentation >> Oct 07, 2023 12:59 PM Almira Coaster wrote: Reason for CRM: Patient received a call from the office;however, there was no documentation on file.

## 2023-10-08 ENCOUNTER — Encounter: Payer: Self-pay | Admitting: Nurse Practitioner

## 2023-10-09 ENCOUNTER — Other Ambulatory Visit: Payer: Self-pay | Admitting: Nurse Practitioner

## 2023-10-15 ENCOUNTER — Other Ambulatory Visit: Payer: Self-pay

## 2023-10-15 DIAGNOSIS — E785 Hyperlipidemia, unspecified: Secondary | ICD-10-CM

## 2023-10-15 MED ORDER — ATORVASTATIN CALCIUM 20 MG PO TABS
20.0000 mg | ORAL_TABLET | Freq: Every day | ORAL | 0 refills | Status: AC
Start: 2023-10-15 — End: ?

## 2023-10-31 ENCOUNTER — Telehealth: Payer: Self-pay

## 2023-10-31 NOTE — Telephone Encounter (Signed)
 Copied from CRM [id number]. Topic: General - Other >> Oct 31, 2023 10:35 AM Shelah Lewandowsky wrote: Reason for CRM: calling to see if letter is ready for pick up- [phone]

## 2023-11-05 ENCOUNTER — Telehealth: Payer: Self-pay | Admitting: Nurse Practitioner

## 2023-11-05 ENCOUNTER — Telehealth: Payer: Self-pay

## 2023-11-05 NOTE — Telephone Encounter (Signed)
 This was sent to our office by mistake. Thank you   Copied from CRM [id number]. Topic: General - Other >> Oct 31, 2023 10:35 AM Shelah Lewandowsky wrote: Reason for CRM: calling to see if letter is ready for pick up- [phone] >> Nov 04, 2023  3:11 PM Clayton Bibles wrote: Omar Rogers is calling to see when he can pick up the letter about his financial care giver has passed away. The first letter had to be corrected per [employer].  He has an appointment Thursday with Social Security Office.  Please call him at [phone] Thanks

## 2023-11-05 NOTE — Telephone Encounter (Signed)
 Patient is calling in reference to a letter that was send to social security. He stated that the letter was not signed

## 2023-11-06 ENCOUNTER — Telehealth: Payer: Self-pay

## 2023-11-07 NOTE — Telephone Encounter (Signed)
 Completed.

## 2024-01-02 ENCOUNTER — Other Ambulatory Visit: Payer: Self-pay | Admitting: Nurse Practitioner

## 2024-01-02 DIAGNOSIS — I10 Essential (primary) hypertension: Secondary | ICD-10-CM

## 2024-01-09 ENCOUNTER — Ambulatory Visit: Payer: Self-pay | Admitting: Nurse Practitioner

## 2024-01-10 ENCOUNTER — Other Ambulatory Visit: Payer: Self-pay | Admitting: Nurse Practitioner

## 2024-01-10 DIAGNOSIS — E785 Hyperlipidemia, unspecified: Secondary | ICD-10-CM

## 2024-02-13 ENCOUNTER — Encounter: Payer: Self-pay | Admitting: Nurse Practitioner

## 2024-02-13 ENCOUNTER — Ambulatory Visit (INDEPENDENT_AMBULATORY_CARE_PROVIDER_SITE_OTHER): Payer: Self-pay | Admitting: Nurse Practitioner

## 2024-02-13 VITALS — BP 118/70 | HR 70 | Temp 97.6°F | Wt 199.0 lb

## 2024-02-13 DIAGNOSIS — Z13220 Encounter for screening for lipoid disorders: Secondary | ICD-10-CM | POA: Diagnosis not present

## 2024-02-13 DIAGNOSIS — I10 Essential (primary) hypertension: Secondary | ICD-10-CM

## 2024-02-13 DIAGNOSIS — Z1329 Encounter for screening for other suspected endocrine disorder: Secondary | ICD-10-CM

## 2024-02-13 NOTE — Patient Instructions (Signed)
 Health Maintenance, Male  Adopting a healthy lifestyle and getting preventive care are important in promoting health and wellness. Ask your health care provider about:  The right schedule for you to have regular tests and exams.  Things you can do on your own to prevent diseases and keep yourself healthy.  What should I know about diet, weight, and exercise?  Eat a healthy diet    Eat a diet that includes plenty of vegetables, fruits, low-fat dairy products, and lean protein.  Do not eat a lot of foods that are high in solid fats, added sugars, or sodium.  Maintain a healthy weight  Body mass index (BMI) is a measurement that can be used to identify possible weight problems. It estimates body fat based on height and weight. Your health care provider can help determine your BMI and help you achieve or maintain a healthy weight.  Get regular exercise  Get regular exercise. This is one of the most important things you can do for your health. Most adults should:  Exercise for at least 150 minutes each week. The exercise should increase your heart rate and make you sweat (moderate-intensity exercise).  Do strengthening exercises at least twice a week. This is in addition to the moderate-intensity exercise.  Spend less time sitting. Even light physical activity can be beneficial.  Watch cholesterol and blood lipids  Have your blood tested for lipids and cholesterol at 71 years of age, then have this test every 5 years.  You may need to have your cholesterol levels checked more often if:  Your lipid or cholesterol levels are high.  You are older than 71 years of age.  You are at high risk for heart disease.  What should I know about cancer screening?  Many types of cancers can be detected early and may often be prevented. Depending on your health history and family history, you may need to have cancer screening at various ages. This may include screening for:  Colorectal cancer.  Prostate cancer.  Skin cancer.  Lung  cancer.  What should I know about heart disease, diabetes, and high blood pressure?  Blood pressure and heart disease  High blood pressure causes heart disease and increases the risk of stroke. This is more likely to develop in people who have high blood pressure readings or are overweight.  Talk with your health care provider about your target blood pressure readings.  Have your blood pressure checked:  Every 3-5 years if you are 9-95 years of age.  Every year if you are 85 years old or older.  If you are between the ages of 29 and 29 and are a current or former smoker, ask your health care provider if you should have a one-time screening for abdominal aortic aneurysm (AAA).  Diabetes  Have regular diabetes screenings. This checks your fasting blood sugar level. Have the screening done:  Once every three years after age 23 if you are at a normal weight and have a low risk for diabetes.  More often and at a younger age if you are overweight or have a high risk for diabetes.  What should I know about preventing infection?  Hepatitis B  If you have a higher risk for hepatitis B, you should be screened for this virus. Talk with your health care provider to find out if you are at risk for hepatitis B infection.  Hepatitis C  Blood testing is recommended for:  Everyone born from [birth year] through 1963.  Anyone  with known risk factors for hepatitis C.  Sexually transmitted infections (STIs)  You should be screened each year for STIs, including gonorrhea and chlamydia, if:  You are sexually active and are younger than 71 years of age.  You are older than 71 years of age and your health care provider tells you that you are at risk for this type of infection.  Your sexual activity has changed since you were last screened, and you are at increased risk for chlamydia or gonorrhea. Ask your health care provider if you are at risk.  Ask your health care provider about whether you are at high risk for HIV. Your health care provider  may recommend a prescription medicine to help prevent HIV infection. If you choose to take medicine to prevent HIV, you should first get tested for HIV. You should then be tested every 3 months for as long as you are taking the medicine.  Follow these instructions at home:  Alcohol use  Do not drink alcohol if your health care provider tells you not to drink.  If you drink alcohol:  Limit how much you have to 0-2 drinks a day.  Know how much alcohol is in your drink. In the [place] one drink equals one 12 oz bottle of beer (355 mL), one 5 oz glass of wine (148 mL), or one 1 oz glass of hard liquor (44 mL).  Lifestyle  Do not use any products that contain nicotine or tobacco. These products include cigarettes, chewing tobacco, and vaping devices, such as e-cigarettes. If you need help quitting, ask your health care provider.  Do not use street drugs.  Do not share needles.  Ask your health care provider for help if you need support or information about quitting drugs.  General instructions  Schedule regular health, dental, and eye exams.  Stay current with your vaccines.  Tell your health care provider if:  You often feel depressed.  You have ever been abused or do not feel safe at home.  Summary  Adopting a healthy lifestyle and getting preventive care are important in promoting health and wellness.  Follow your health care provider's instructions about healthy diet, exercising, and getting tested or screened for diseases.  Follow your health care provider's instructions on monitoring your cholesterol and blood pressure.  This information is not intended to replace advice given to you by your health care provider. Make sure you discuss any questions you have with your health care provider.  Document Revised: 12/04/2020 Document Reviewed: 12/04/2020  Elsevier Patient Education  2024 [employer].

## 2024-02-13 NOTE — Progress Notes (Signed)
 Subjective   Patient ID: Omar [facility], male    DOB: [birth year], 73 y.o.   MRN: [id number]  Chief Complaint  Patient presents with   Medical Management of Chronic Issues    Follow up, patient stated that his bp has been good    Referring provider: Oley Bascom RAMAN, NP  Omar [facility] is a 73 y.o. 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 overall has been doing well. He is exercising and is adherent to low salt diet.  Cardiac symptoms none. Patient denies chest pain, dyspnea, lower extremity edema, and orthopnea.  Cardiovascular risk factors: advanced age (older than 81 for men, 11 for women), hypertension, and male gender. Use of agents associated with hypertension: none. History of target organ damage: none. Currently goes to the gym intermittently during the week. Compliant with all medications. Denies any other concerns today.    Hypothyroid:   Patient is currently on Synthroid .  He is compliant with medications.   Hyperlipidemia:   Patient is currently on atorvastatin  he is compliant with medications.  Overall tolerating well.   Denies any fatigue, chest pain, shortness of breath, HA or dizziness. Denies any blurred vision, numbness or tingling.     Allergies  Allergen Reactions   Chlorpromazine Other (See Comments)    unknown reaction- reported from previous hospital records   Haloperidol Other (See Comments)    Unknown reaction-reported via hospital records     Immunization History  Administered Date(s) Administered   PFIZER(Purple Top)SARS-COV-2 Vaccination 09/30/2019, 10/27/2019, 06/12/2020    Tobacco History: Social History   Tobacco Use  Smoking Status Former   Current packs/day: 0.50   Average packs/day: 0.5 packs/day for 5.0 years (2.5 ttl pk-yrs)   Types: Cigarettes  Smokeless Tobacco Never  Tobacco Comments   06/29/2013 quit smoking ~ 2 yr ago   Counseling given: Not Answered Tobacco comments: 06/29/2013 quit smoking ~ 2 yr ago   Outpatient Encounter Medications as of 02/13/2024  Medication Sig   amLODipine  (NORVASC ) 10 MG tablet Take 1 tablet by mouth once daily   atorvastatin  (LIPITOR) 20 MG tablet Take 1 tablet by mouth once daily   levothyroxine  (SYNTHROID ) 125 MCG tablet Take 1 tablet by mouth once daily   Multiple Vitamins-Minerals (MULTIVITAMIN WITH MINERALS) tablet Take 1 tablet by mouth daily.   QUEtiapine (SEROQUEL) 100 MG tablet Take 100 mg by mouth at bedtime.   No facility-administered encounter medications on file as of 02/13/2024.    Review of Systems  Review of Systems  Constitutional: Negative.   HENT: Negative.    Cardiovascular: Negative.   Gastrointestinal: Negative.   Allergic/Immunologic: Negative.   Neurological: Negative.   Psychiatric/Behavioral: Negative.       Objective:   BP 118/70   Pulse 70   Temp 97.6 F (36.4 C) (Oral)   Wt 199 lb (90.3 kg)   SpO2 94%   BMI 28.55 kg/m   Wt Readings from Last 5 Encounters:  02/13/24 199 lb (90.3 kg)  07/11/23 208 lb (94.3  kg)  03/13/23 198 lb (89.8 kg)  07/09/21 194 lb 9.6 oz (88.3 kg)  01/09/21 189 lb (85.7 kg)     Physical Exam Vitals and nursing note reviewed.  Constitutional:      General: He is not in acute distress.    Appearance: He is well-developed.  Cardiovascular:     Rate and Rhythm: Normal rate and regular rhythm.  Pulmonary:     Effort: Pulmonary effort is normal.     Breath sounds: Normal breath sounds.   Skin:    General: Skin is warm and dry.  Neurological:     Mental Status: He is alert and oriented to person, place, and time.       Assessment & Plan:   Thyroid  disorder screen -     TSH  Lipid screening -     Lipid panel  Essential hypertension, benign -     CBC -     Comprehensive metabolic panel with GFR     Return in about 6 months (around 08/15/2024).   Bascom GORMAN Borer, NP 02/13/2024

## 2024-02-14 LAB — CBC
Hematocrit: 47.7 % (ref 37.5–51.0)
Hemoglobin: 16.1 g/dL (ref 13.0–17.7)
MCH: 35 pg — ABNORMAL HIGH (ref 26.6–33.0)
MCHC: 33.8 g/dL (ref 31.5–35.7)
MCV: 104 fL — ABNORMAL HIGH (ref 79–97)
Platelets: 180 x10E3/uL (ref 150–450)
RBC: 4.6 x10E6/uL (ref 4.14–5.80)
RDW: 12.9 % (ref 11.6–15.4)
WBC: 7.2 x10E3/uL (ref 3.4–10.8)

## 2024-02-14 LAB — TSH: TSH: 1.5 u[IU]/mL (ref 0.450–4.500)

## 2024-02-14 LAB — COMPREHENSIVE METABOLIC PANEL WITH GFR
ALT: 141 IU/L — ABNORMAL HIGH (ref 0–44)
AST: 167 IU/L — ABNORMAL HIGH (ref 0–40)
Albumin: 4.5 g/dL (ref 3.9–4.9)
Alkaline Phosphatase: 98 IU/L (ref 44–121)
BUN/Creatinine Ratio: 11 (ref 10–24)
BUN: 12 mg/dL (ref 8–27)
Bilirubin Total: 0.9 mg/dL (ref 0.0–1.2)
CO2: 20 mmol/L (ref 20–29)
Calcium: 10 mg/dL (ref 8.6–10.2)
Chloride: 99 mmol/L (ref 96–106)
Creatinine, Ser: 1.1 mg/dL (ref 0.76–1.27)
Globulin, Total: 2.5 g/dL (ref 1.5–4.5)
Glucose: 106 mg/dL — ABNORMAL HIGH (ref 70–99)
Potassium: 3.7 mmol/L (ref 3.5–5.2)
Sodium: 137 mmol/L (ref 134–144)
Total Protein: 7 g/dL (ref 6.0–8.5)
eGFR: 72 mL/min/1.73 (ref >59)

## 2024-02-14 LAB — LIPID PANEL
Chol/HDL Ratio: 3.6 ratio (ref 0.0–5.0)
Cholesterol, Total: 133 mg/dL (ref 100–199)
HDL: 37 mg/dL — ABNORMAL LOW (ref >39)
LDL Chol Calc (NIH): 55 mg/dL (ref 0–99)
Triglycerides: 258 mg/dL — ABNORMAL HIGH (ref 0–149)
VLDL Cholesterol Cal: 41 mg/dL — ABNORMAL HIGH (ref 5–40)

## 2024-02-16 ENCOUNTER — Ambulatory Visit: Payer: Self-pay | Admitting: Nurse Practitioner

## 2024-02-16 DIAGNOSIS — R7989 Other specified abnormal findings of blood chemistry: Secondary | ICD-10-CM

## 2024-03-18 ENCOUNTER — Ambulatory Visit: Payer: Self-pay

## 2024-04-11 ENCOUNTER — Other Ambulatory Visit: Payer: Self-pay | Admitting: Nurse Practitioner

## 2024-04-11 DIAGNOSIS — E785 Hyperlipidemia, unspecified: Secondary | ICD-10-CM

## 2024-05-04 ENCOUNTER — Encounter: Payer: Self-pay | Admitting: Nurse Practitioner

## 2024-05-14 ENCOUNTER — Encounter: Payer: Self-pay | Admitting: Gastroenterology

## 2024-05-14 ENCOUNTER — Ambulatory Visit (INDEPENDENT_AMBULATORY_CARE_PROVIDER_SITE_OTHER): Admitting: Gastroenterology

## 2024-05-14 ENCOUNTER — Other Ambulatory Visit

## 2024-05-14 VITALS — BP 140/70 | HR 97 | Ht 70.0 in | Wt 206.0 lb

## 2024-05-14 DIAGNOSIS — R7989 Other specified abnormal findings of blood chemistry: Secondary | ICD-10-CM

## 2024-05-14 DIAGNOSIS — F1090 Alcohol use, unspecified, uncomplicated: Secondary | ICD-10-CM

## 2024-05-14 DIAGNOSIS — R945 Abnormal results of liver function studies: Secondary | ICD-10-CM | POA: Diagnosis not present

## 2024-05-14 LAB — PROTIME-INR
INR: 1.1 ratio — ABNORMAL HIGH (ref 0.8–1.0)
Prothrombin Time: 11.6 s (ref 9.6–13.1)

## 2024-05-14 LAB — COMPREHENSIVE METABOLIC PANEL WITH GFR
ALT: 58 U/L — ABNORMAL HIGH (ref 0–53)
AST: 69 U/L — ABNORMAL HIGH (ref 0–37)
Albumin: 5 g/dL (ref 3.5–5.2)
Alkaline Phosphatase: 100 U/L (ref 39–117)
BUN: 19 mg/dL (ref 6–23)
CO2: 26 meq/L (ref 19–32)
Calcium: 10.1 mg/dL (ref 8.4–10.5)
Chloride: 101 meq/L (ref 96–112)
Creatinine, Ser: 1.03 mg/dL (ref 0.40–1.50)
GFR: 73.39 mL/min (ref >60.00)
Glucose, Bld: 111 mg/dL — ABNORMAL HIGH (ref 70–99)
Potassium: 3.7 meq/L (ref 3.5–5.1)
Sodium: 140 meq/L (ref 135–145)
Total Bilirubin: 0.8 mg/dL (ref 0.2–1.2)
Total Protein: 8.1 g/dL (ref 6.0–8.3)

## 2024-05-14 LAB — CBC WITH DIFFERENTIAL/PLATELET
Basophils Absolute: 0.1 K/uL (ref 0.0–0.1)
Basophils Relative: 0.9 % (ref 0.0–3.0)
Eosinophils Absolute: 0.4 K/uL (ref 0.0–0.7)
Eosinophils Relative: 4.9 % (ref 0.0–5.0)
HCT: 45.8 % (ref 39.0–52.0)
Hemoglobin: 15.6 g/dL (ref 13.0–17.0)
Lymphocytes Relative: 12.9 % (ref 12.0–46.0)
Lymphs Abs: 1 K/uL (ref 0.7–4.0)
MCHC: 34.1 g/dL (ref 30.0–36.0)
MCV: 103.5 fl — ABNORMAL HIGH (ref 78.0–100.0)
Monocytes Absolute: 0.9 K/uL (ref 0.1–1.0)
Monocytes Relative: 11.6 % (ref 3.0–12.0)
Neutro Abs: 5.3 K/uL (ref 1.4–7.7)
Neutrophils Relative %: 69.7 % (ref 43.0–77.0)
Platelets: 168 K/uL (ref 150.0–400.0)
RBC: 4.42 Mil/uL (ref 4.22–5.81)
RDW: 13.9 % (ref 11.5–15.5)
WBC: 7.6 K/uL (ref 4.0–10.5)

## 2024-05-14 NOTE — Patient Instructions (Signed)
 Your provider has requested that you go to the basement level for lab work before leaving today. Press B on the elevator. The lab is located at the first door on the left as you exit the elevator.  _______________________________________________________  If your blood pressure at your visit was 140/90 or greater, please contact your primary care physician to follow up on this.  _______________________________________________________  If you are age 71 or older, your body mass index should be between 23-30. Your Body mass index is 29.56 kg/m. If this is out of the aforementioned range listed, please consider follow up with your Primary Care Provider.  If you are age 50 or younger, your body mass index should be between 19-25. Your Body mass index is 29.56 kg/m. If this is out of the aformentioned range listed, please consider follow up with your Primary Care Provider.   ________________________________________________________  The [facility] providers would like to encourage you to use MYCHART to communicate with providers for non-urgent requests or questions.  Due to long hold times on the telephone, sending your provider a message by [facility] may be a faster and more efficient way to get a response.  Please allow 48 business hours for a response.  Please remember that this is for non-urgent requests.  _______________________________________________________  [facility] is using a team-based approach to care.  Your team is made up of your doctor and two to three APPS. Our APPS (Nurse Practitioners and Physician Assistants) work with your physician to ensure care continuity for you. They are fully qualified to address your health concerns and develop a treatment plan. They communicate directly with your gastroenterologist to care for you. Seeing the Advanced Practice Practitioners on your physician's team can help you by facilitating care more promptly, often allowing for earlier  appointments, access to diagnostic testing, procedures, and other specialty referrals.

## 2024-05-15 NOTE — Progress Notes (Unsigned)
 Chief Complaint: Elevated LFTs Primary GI MD: Dr. Stacia  HPI: Discussed the use of AI scribe software for clinical note transcription with the patient, who gave verbal consent to proceed.  Omar [facility] is a 73 year old male, history of DVT/May-Thurner syndrome, bipolar disorder, chronically elevated liver enzymes in setting of alcohol use who presents for follow-up of elevated liver enzymes.  Last seen by Dr. London 10/2022.  At that time.  He had had mild elevated liver enzymes dating back to July 2021 with ALT/AST in 50s/60s.  December 2023 AST 1236, ALT 158, normal alk phos and bilirubin.  Patient had extensive workup with Dr. Stacia that rule out viral/autoimmune/genetic causes of liver disease: Appears his chronically elevated liver enzymes were in the setting of extensive alcohol use.  Elastography 11/2022 showed median K PA 6.3 and increased parenchymal echogenicity of the liver compatible with hepatic steatosis  His liver enzymes have been elevated since at least April of last year, with a comprehensive workup at that time revealing no abnormalities. He recalls that elastography was performed as part of his prior workup. Previous evaluations have ruled out hepatitis, autoimmune conditions, and genetic causes. His last lab work in July still showed elevated liver enzymes.  He continues to consume alcohol daily, primarily light beer, estimating about five to six beers per day.  No abdominal pain, changes in bowel habits, blood in stool, black stool, vomiting blood, fluid buildup in legs or abdomen, or bloating. He completed a Cologuard test for colon cancer, which was negative.   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     QUEtiapine (SEROQUEL) 100 MG tablet Take 100 mg by mouth at bedtime.     No current facility-administered medications for this visit.    Allergies as of 05/14/2024 - Review Complete 05/14/2024  Allergen Reaction Noted   Chlorpromazine Other (See Comments) 05/13/2014   Haloperidol Other (See Comments) 05/13/2014    Family History  Problem Relation Age of Onset   Hypertension Mother    Kidney cancer Mother    Hypertension Father    Heart disease Father    Hypertension Brother    Thyroid  cancer Paternal Grandmother    Lung cancer Paternal Grandfather     Social History   Socioeconomic History   Marital status: Single    Spouse name: Not on file   Number of children: 1   Years of education: Not on file   Highest education level: Not on file  Occupational History   Not on file  Tobacco Use  Smoking status: Former    Current packs/day: 0.50    Average packs/day: 0.5 packs/day for 5.0 years (2.5 ttl pk-yrs)    Types: Cigarettes   Smokeless tobacco: Never   Tobacco comments:    06/29/2013 quit smoking ~ 2 yr ago  Vaping Use   Vaping status: Never Used  Substance and Sexual Activity   Alcohol use: Yes    Alcohol/week: 42.0 standard drinks of alcohol    Types: 42 Cans of beer per week    Comment: 5 per day   Drug use: No    Sexual activity: Not Currently  Other Topics Concern   Not on file  Social History Narrative   Not on file   Social Drivers of Health   Financial Resource Strain: Low Risk  (03/13/2023)   Overall Financial Resource Strain (CARDIA)    Difficulty of Paying Living Expenses: Not hard at all  Food Insecurity: No Food Insecurity (03/18/2024)   Hunger Vital Sign    Worried About Running Out of Food in the Last Year: Never true    Ran Out of Food in the Last Year: Never true  Transportation Needs: No Transportation Needs (03/18/2024)   PRAPARE - Administrator, Civil Service (Medical): No    Lack of Transportation (Non-Medical): No  Physical Activity: Sufficiently Active (03/13/2023)   Exercise Vital Sign    Days of Exercise per Week: 7 days    Minutes of Exercise per Session: 80 min  Stress: No Stress Concern Present (03/13/2023)   [employer] of Occupational Health - Occupational Stress Questionnaire    Feeling of Stress : Not at all  Social Connections: Moderately Isolated (03/13/2023)   Social Connection and Isolation Panel    Frequency of Communication with Friends and Family: More than three times a week    Frequency of Social Gatherings with Friends and Family: Never    Attends Religious Services: Never    Database administrator or Organizations: No    Attends Engineer, structural: More than 4 times per year    Marital Status: Widowed  Intimate Partner Violence: Not At Risk (03/13/2023)   Humiliation, Afraid, Rape, and Kick questionnaire    Fear of Current or Ex-Partner: No    Emotionally Abused: No    Physically Abused: No    Sexually Abused: No    Review of Systems:    Constitutional: No weight loss, fever, chills, weakness or fatigue HEENT: Eyes: No change in vision               Ears, Nose, Throat:  No change in hearing or congestion Skin: No rash or itching Cardiovascular: No chest pain, chest pressure or palpitations   Respiratory: No SOB or  cough Gastrointestinal: See HPI and otherwise negative Genitourinary: No dysuria or change in urinary frequency Neurological: No headache, dizziness or syncope Musculoskeletal: No new muscle or joint pain Hematologic: No bleeding or bruising Psychiatric: No history of depression or anxiety    Physical Exam:  Vital signs: BP (!) 140/70   Pulse 97   Ht 5' 10 (1.778 m)   Wt 206 lb (93.4 kg)   BMI 29.56 kg/m   Constitutional: NAD, alert and cooperative Head:  Normocephalic and atraumatic. Eyes:   PEERL, EOMI. No icterus. Conjunctiva pink. Respiratory: Respirations even and unlabored. Lungs clear to auscultation bilaterally.   No wheezes, crackles, or rhonchi.  Cardiovascular:  Regular rate and rhythm. No peripheral edema, cyanosis or pallor.  Gastrointestinal:  Soft,  nondistended, nontender. No rebound or guarding. Normal bowel sounds. No appreciable masses or hepatomegaly. Rectal:  Declines Msk:  Symmetrical without gross deformities. Without edema, no deformity or joint abnormality.  Neurologic:  Alert and  oriented x4;  grossly normal neurologically.  Skin:   Dry and intact without significant lesions or rashes. Psychiatric: Oriented to person, place and time. Demonstrates good judgement and reason without abnormal affect or behaviors.  RELEVANT LABS AND IMAGING: CBC    Component Value Date/Time   WBC 7.6 05/14/2024 1502   RBC 4.42 05/14/2024 1502   HGB 15.6 05/14/2024 1502   HGB 16.1 02/13/2024 1019   HCT 45.8 05/14/2024 1502   HCT 47.7 02/13/2024 1019   PLT 168.0 05/14/2024 1502   PLT 180 02/13/2024 1019   MCV 103.5 (H) 05/14/2024 1502   MCV 104 (H) 02/13/2024 1019   MCH 35.0 (H) 02/13/2024 1019   MCH 35.7 (H) 06/23/2017 1355   MCHC 34.1 05/14/2024 1502   RDW 13.9 05/14/2024 1502   RDW 12.9 02/13/2024 1019   LYMPHSABS 1.0 05/14/2024 1502   LYMPHSABS 1.0 01/08/2022 1057   MONOABS 0.9 05/14/2024 1502   EOSABS 0.4 05/14/2024 1502   EOSABS 0.2 01/08/2022 1057    BASOSABS 0.1 05/14/2024 1502   BASOSABS 0.0 01/08/2022 1057    CMP     Component Value Date/Time   NA 140 05/14/2024 1502   NA 137 02/13/2024 1019   K 3.7 05/14/2024 1502   CL 101 05/14/2024 1502   CO2 26 05/14/2024 1502   GLUCOSE 111 (H) 05/14/2024 1502   BUN 19 05/14/2024 1502   BUN 12 02/13/2024 1019   CREATININE 1.03 05/14/2024 1502   CREATININE 0.98 09/13/2013 0912   CALCIUM  10.1 05/14/2024 1502   PROT 8.1 05/14/2024 1502   PROT 7.0 02/13/2024 1019   ALBUMIN 5.0 05/14/2024 1502   ALBUMIN 4.5 02/13/2024 1019   AST 69 (H) 05/14/2024 1502   ALT 58 (H) 05/14/2024 1502   ALKPHOS 100 05/14/2024 1502   BILITOT 0.8 05/14/2024 1502   BILITOT 0.9 02/13/2024 1019   GFRNONAA 72 02/07/2020 1100   GFRNONAA 83 09/13/2013 0912   GFRAA 83 02/07/2020 1100   GFRAA >89 09/13/2013 0912     Assessment/Plan:   73 year old male history of heavy alcohol use presenting for follow-up of elevated liver enzymes last seen by Dr. Stacia 10/2022  Elevated LFTs History of AST predominant elevated liver enzymes with extensive workup ruling out viral/autoimmune/genetic causes of liver disease.  Most recent LFTs 01/2024 appears stable compared to his past draws with AST 167/ALT 141, alk phos and total bilirubin normal. elastography showed median kPa 6.3.  Patient continues to drink 5-6 beers per day, he states he is not interested in help with stopping his alcohol use at this time.  Extensive time was spent educating patient on importance of complete cessation of alcohol use to prevent inevitable liver damage and probable cirrhosis in the future - Recheck LFTs today (CBC/CMP) - Recommendation of complete cessation of alcohol use - Extensive education on importance of stopping alcohol and the effects on the liver as well as risks of cirrhosis.  Offered options for alcohol cessation programs which patient politely declined  Colon cancer screening Negative Cologuard 2024 - Repeat 2027     Nestor Blower, PA-C [facility] 05/15/2024, 12:44 PM  Cc: Oley Bascom RAMAN, NP

## 2024-05-16 ENCOUNTER — Encounter: Payer: Self-pay | Admitting: Gastroenterology

## 2024-05-17 ENCOUNTER — Ambulatory Visit: Payer: Self-pay | Admitting: Gastroenterology

## 2024-05-23 NOTE — Progress Notes (Signed)
 Agree with the assessment and plan as outlined by Boone Master, PA-C.

## 2024-05-30 ENCOUNTER — Other Ambulatory Visit: Payer: Self-pay | Admitting: Nurse Practitioner

## 2024-06-22 ENCOUNTER — Other Ambulatory Visit: Payer: Self-pay | Admitting: Nurse Practitioner

## 2024-06-22 DIAGNOSIS — I10 Essential (primary) hypertension: Secondary | ICD-10-CM

## 2024-07-09 ENCOUNTER — Other Ambulatory Visit: Payer: Self-pay | Admitting: Nurse Practitioner

## 2024-07-09 DIAGNOSIS — E785 Hyperlipidemia, unspecified: Secondary | ICD-10-CM

## 2024-08-16 ENCOUNTER — Ambulatory Visit (INDEPENDENT_AMBULATORY_CARE_PROVIDER_SITE_OTHER): Payer: Self-pay | Admitting: Nurse Practitioner

## 2024-08-16 ENCOUNTER — Encounter: Payer: Self-pay | Admitting: Nurse Practitioner

## 2024-08-16 VITALS — BP 118/68 | HR 79 | Temp 96.1°F | Wt 204.0 lb

## 2024-08-16 DIAGNOSIS — E785 Hyperlipidemia, unspecified: Secondary | ICD-10-CM

## 2024-08-16 DIAGNOSIS — I10 Essential (primary) hypertension: Secondary | ICD-10-CM | POA: Diagnosis not present

## 2024-08-16 DIAGNOSIS — E039 Hypothyroidism, unspecified: Secondary | ICD-10-CM

## 2024-08-16 NOTE — Progress Notes (Signed)
 "  Subjective   Patient ID: Omar Rogers, male    DOB: November 16, 1950, 74 y.o.   MRN: [id number]  Chief Complaint  Patient presents with   Thyroid  Problem    6 month f/u.     Referring provider: Oley Bascom RAMAN, NP  NORFLEET CAPERS is a 74 y.o. 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 overall has been doing well. He is exercising and is adherent to low salt diet.  Cardiac symptoms none. Patient denies chest pain, dyspnea, lower extremity edema, and orthopnea.  Cardiovascular risk factors: advanced age (older than 59 for men, 71 for women), hypertension, and male gender. Use of agents associated with hypertension: none. History of target organ damage: none. Currently goes to the gym intermittently during the week. Compliant with all medications. Denies any other concerns today. does not need refills today.    Hypothyroid:   Patient is currently on Synthroid .  He is compliant with medications.   Hyperlipidemia:   Patient is currently on atorvastatin  he is compliant with medications.  Overall tolerating well.   Denies any fatigue, chest pain, shortness of breath, HA or dizziness. Denies any blurred vision, numbness or tingling.  Follows with GI for elevated liver    Allergies[1]  Immunization History  Administered Date(s) Administered   PFIZER(Purple Top)SARS-COV-2 Vaccination 09/30/2019, 10/27/2019, 06/12/2020    Tobacco History: Tobacco Use History[2] Counseling given:  Not Answered Tobacco comments: 06/29/2013 quit smoking ~ 2 yr ago   Outpatient Encounter Medications as of 08/16/2024  Medication Sig   amLODipine  (NORVASC ) 10 MG tablet Take 1 tablet by mouth once daily   atorvastatin  (LIPITOR) 20 MG tablet Take 1 tablet by mouth once daily   levothyroxine  (SYNTHROID ) 125 MCG tablet Take 1 tablet by mouth once daily   Multiple Vitamins-Minerals (MULTIVITAMIN WITH MINERALS) tablet Take 1 tablet by mouth daily.   QUEtiapine (SEROQUEL) 100 MG tablet Take 100 mg by mouth at bedtime.   No facility-administered encounter medications on file as of 08/16/2024.    Review of Systems  Review of Systems  Constitutional: Negative.   HENT: Negative.    Cardiovascular: Negative.   Gastrointestinal: Negative.   Allergic/Immunologic: Negative.   Neurological: Negative.   Psychiatric/Behavioral: Negative.       Objective:   BP 118/68   Pulse 79   Temp (!) 96.1 F (35.6 C) (Temporal)   Wt 204 lb (92.5 kg)   SpO2 94%   BMI 29.27 kg/m   Wt Readings from Last 5 Encounters:  08/16/24 204 lb (92.5 kg)  05/14/24 206 lb (93.4 kg)  03/18/24 199 lb (90.3 kg)  02/13/24 199 lb (90.3 kg)  07/11/23 208 lb (94.3 kg)     Physical Exam Vitals and nursing note reviewed.  Constitutional:      General: He is not in acute distress.    Appearance: He is well-developed.  Cardiovascular:     Rate and Rhythm: Normal rate and regular rhythm.  Pulmonary:     Effort: Pulmonary effort is normal.  Breath sounds: Normal breath sounds.  Skin:    General: Skin is warm and dry.  Neurological:     Mental Status: He is alert and oriented to person, place, and time.       Assessment & Plan:   Essential hypertension, benign -     CBC -     Comprehensive metabolic panel with GFR  Hyperlipidemia, unspecified hyperlipidemia type -     Lipid panel  Hypothyroidism, unspecified type -     Thyroid  Panel With TSH     Return in about 6 months (around 02/13/2025).     Bascom GORMAN Borer, NP 08/16/2024     [1]  Allergies Allergen Reactions   Chlorpromazine Other (See Comments)    unknown reaction- reported from previous hospital records   Haloperidol Other (See Comments)    Unknown reaction-reported via hospital records  [2]  Social History Tobacco Use  Smoking Status Former   Current packs/day: 0.50   Average packs/day: 0.5 packs/day for 5.0 years (2.5 ttl pk-yrs)   Types: Cigarettes  Smokeless Tobacco Never  Tobacco Comments   06/29/2013 quit smoking ~ 2 yr ago   "

## 2024-08-17 LAB — COMPREHENSIVE METABOLIC PANEL WITH GFR
ALT: 68 IU/L — ABNORMAL HIGH (ref 0–44)
AST: 65 IU/L — ABNORMAL HIGH (ref 0–40)
Albumin: 4.4 g/dL (ref 3.8–4.8)
Alkaline Phosphatase: 107 IU/L (ref 47–123)
BUN/Creatinine Ratio: 13 (ref 10–24)
BUN: 14 mg/dL (ref 8–27)
Bilirubin Total: 0.8 mg/dL (ref 0.0–1.2)
CO2: 20 mmol/L (ref 20–29)
Calcium: 9.7 mg/dL (ref 8.6–10.2)
Chloride: 100 mmol/L (ref 96–106)
Creatinine, Ser: 1.06 mg/dL (ref 0.76–1.27)
Globulin, Total: 2.3 g/dL (ref 1.5–4.5)
Glucose: 186 mg/dL — ABNORMAL HIGH (ref 70–99)
Potassium: 4.2 mmol/L (ref 3.5–5.2)
Sodium: 138 mmol/L (ref 134–144)
Total Protein: 6.7 g/dL (ref 6.0–8.5)
eGFR: 75 mL/min/1.73

## 2024-08-17 LAB — THYROID PANEL WITH TSH
Free Thyroxine Index: 3.6 (ref 1.2–4.9)
T3 Uptake Ratio: 31 % (ref 24–39)
T4, Total: 11.5 ug/dL (ref 4.5–12.0)
TSH: 0.353 u[IU]/mL — ABNORMAL LOW (ref 0.450–4.500)

## 2024-08-17 LAB — CBC
Hematocrit: 46.5 % (ref 37.5–51.0)
Hemoglobin: 15.9 g/dL (ref 13.0–17.7)
MCH: 36 pg — ABNORMAL HIGH (ref 26.6–33.0)
MCHC: 34.2 g/dL (ref 31.5–35.7)
MCV: 105 fL — ABNORMAL HIGH (ref 79–97)
Platelets: 244 x10E3/uL (ref 150–450)
RBC: 4.42 x10E6/uL (ref 4.14–5.80)
RDW: 11.8 % (ref 11.6–15.4)
WBC: 5.6 x10E3/uL (ref 3.4–10.8)

## 2024-08-17 LAB — LIPID PANEL
Chol/HDL Ratio: 4.4 ratio (ref 0.0–5.0)
Cholesterol, Total: 149 mg/dL (ref 100–199)
HDL: 34 mg/dL — ABNORMAL LOW
LDL Chol Calc (NIH): 79 mg/dL (ref 0–99)
Triglycerides: 211 mg/dL — ABNORMAL HIGH (ref 0–149)
VLDL Cholesterol Cal: 36 mg/dL (ref 5–40)

## 2024-08-18 ENCOUNTER — Ambulatory Visit: Payer: Self-pay | Admitting: Nurse Practitioner

## 2024-08-29 ENCOUNTER — Other Ambulatory Visit: Payer: Self-pay | Admitting: Nurse Practitioner

## 2024-08-31 ENCOUNTER — Other Ambulatory Visit: Payer: Self-pay | Admitting: Nurse Practitioner

## 2025-02-14 ENCOUNTER — Ambulatory Visit: Payer: Self-pay | Admitting: Nurse Practitioner
